# Patient Record
Sex: Male | Born: 1960
Health system: Southern US, Community
[De-identification: ages and names within clinical notes are randomized; demographics above are authoritative.]

## PROBLEM LIST (undated history)

## (undated) DIAGNOSIS — G4730 Sleep apnea, unspecified: Secondary | ICD-10-CM

## (undated) DIAGNOSIS — E785 Hyperlipidemia, unspecified: Secondary | ICD-10-CM

## (undated) DIAGNOSIS — Z8619 Personal history of other infectious and parasitic diseases: Secondary | ICD-10-CM

## (undated) DIAGNOSIS — F1010 Alcohol abuse, uncomplicated: Secondary | ICD-10-CM

## (undated) DIAGNOSIS — M109 Gout, unspecified: Secondary | ICD-10-CM

## (undated) HISTORY — DX: Sleep apnea, unspecified: G47.30

## (undated) HISTORY — DX: Hyperlipidemia, unspecified: E78.5

## (undated) HISTORY — PX: NO PAST SURGERIES: SHX2092

## (undated) HISTORY — DX: Alcohol abuse, uncomplicated: F10.10

## (undated) HISTORY — DX: Gout, unspecified: M10.9

## (undated) HISTORY — DX: Personal history of other infectious and parasitic diseases: Z86.19

---

## 2012-02-28 LAB — HM COLONOSCOPY

## 2013-04-01 DIAGNOSIS — G4730 Sleep apnea, unspecified: Secondary | ICD-10-CM | POA: Insufficient documentation

## 2015-01-24 DIAGNOSIS — M545 Low back pain, unspecified: Secondary | ICD-10-CM | POA: Insufficient documentation

## 2016-03-15 DIAGNOSIS — M109 Gout, unspecified: Secondary | ICD-10-CM | POA: Insufficient documentation

## 2016-03-15 DIAGNOSIS — E782 Mixed hyperlipidemia: Secondary | ICD-10-CM | POA: Insufficient documentation

## 2017-04-24 DIAGNOSIS — Z720 Tobacco use: Secondary | ICD-10-CM | POA: Insufficient documentation

## 2019-02-04 ENCOUNTER — Telehealth: Payer: Self-pay

## 2019-02-04 ENCOUNTER — Encounter: Payer: Self-pay | Admitting: Internal Medicine

## 2019-02-04 NOTE — Telephone Encounter (Signed)
Wife establishing okay per Dr. Larose Kells.

## 2019-02-08 ENCOUNTER — Other Ambulatory Visit: Payer: Self-pay

## 2019-02-08 ENCOUNTER — Encounter: Payer: Self-pay | Admitting: Internal Medicine

## 2019-02-08 ENCOUNTER — Ambulatory Visit (INDEPENDENT_AMBULATORY_CARE_PROVIDER_SITE_OTHER): Payer: 59 | Admitting: Internal Medicine

## 2019-02-08 VITALS — BP 115/52 | HR 56 | Temp 97.9°F | Resp 16 | Ht 66.5 in | Wt 222.4 lb

## 2019-02-08 DIAGNOSIS — Z720 Tobacco use: Secondary | ICD-10-CM

## 2019-02-08 DIAGNOSIS — G4730 Sleep apnea, unspecified: Secondary | ICD-10-CM | POA: Diagnosis not present

## 2019-02-08 NOTE — Patient Instructions (Signed)
  GO TO THE FRONT DESK Schedule your next appointment   For a physical exam in 4 months

## 2019-02-08 NOTE — Progress Notes (Signed)
Pre visit review using our clinic review tool, if applicable. No additional management support is needed unless otherwise documented below in the visit note. 

## 2019-02-08 NOTE — Progress Notes (Signed)
Subjective:    Patient ID: Brent Slimmer., male    DOB: 04-May-1961, 58 y.o.   MRN: [id number]  DOS:  02/08/2019 Type of visit - description: New patient The patient moved to [facility] 4 months ago and needs a new PCP. In general feels well and have no concerns. Good compliance with CPAP He reports has a problem with alcohol, on further questioning, he gets 5 for 6 beers during the weekends and apparently none during the weekdays.   Review of Systems Denies fever chills No chest pain no difficulty breathing No nausea, vomiting, diarrhea  Past Medical History:  Diagnosis Date  . ETOH abuse   . History of chicken pox   . Sleep apnea     Past Surgical History:  Procedure Laterality Date  . NO PAST SURGERIES      Social History   Socioeconomic History  . Marital status: Married    Spouse name: Not on file  . Number of children: 2  . Years of education: Not on file  . Highest education level: Not on file  Occupational History  . Occupation: Optometrist distribution  Social Needs  . Financial resource strain: Not on file  . Food insecurity    Worry: Not on file    Inability: Not on file  . Transportation needs    Medical: Not on file    Non-medical: Not on file  Tobacco Use  . Smoking status: Current Every Day Smoker    Packs/day: 0.50  . Smokeless tobacco: Never Used  Substance and Sexual Activity  . Alcohol use: Yes    Comment: weekends   . Drug use: Not on file  . Sexual activity: Not on file  Lifestyle  . Physical activity    Days per week: Not on file    Minutes per session: Not on file  . Stress: Not on file  Relationships  . Social Herbalist on phone: Not on file    Gets together: Not on file    Attends religious service: Not on file    Active member of club or organization: Not on file    Attends meetings of clubs or organizations: Not on file    Relationship status: Not on file  . Intimate partner violence    Fear of current  or ex partner: Not on file    Emotionally abused: Not on file    Physically abused: Not on file    Forced sexual activity: Not on file  Other Topics Concern  . Not on file  Social History Narrative   Moved from [place] to [place] 09/2018   Household: pt and wife    Has 2 sons from previous marriage     Family History  Problem Relation Age of Onset  . Arthritis Mother   . Hypertension Mother   . Early death Father   . Drug abuse Brother   . Alcohol abuse Brother   . Alcohol abuse Son   . Drug abuse Son   . Depression Brother   . Alcohol abuse Brother   . Colon cancer Neg Hx   . Prostate cancer Neg Hx      Allergies as of 02/08/2019      Reactions   Shellfish Allergy Anaphylaxis      Medication List       Accurate as of February 08, 2019  4:53 PM. If you have any questions, ask your nurse or doctor.  COLLAGEN EX Apply topically.   multivitamin with minerals Tabs tablet Take 1 tablet by mouth daily.   VISION VITAMINS PO Take by mouth.   VITAMIN C PO Take by mouth.   VITAMIN D PO Take by mouth.           Objective:   Physical Exam BP (!) 115/52 (BP Location: Left Arm, Patient Position: Sitting, Cuff Size: Normal)   Pulse (!) 56   Temp 97.9 F (36.6 C) (Temporal)   Resp 16   Ht 5' 6.5" (1.689 m)   Wt 222 lb 6 oz (100.9 kg)   SpO2 99%   BMI 35.35 kg/m  General:   Well developed, NAD, BMI noted.  HEENT:  Normocephalic . Face symmetric, atraumatic Lungs:  CTA B Normal respiratory effort, no intercostal retractions, no accessory muscle use. Heart: RRR,  no murmur.  no pretibial edema bilaterally  Abdomen:  Not distended, soft, non-tender. No rebound or rigidity.   Skin: Not pale. Not jaundice Neurologic:  alert & oriented X3.  Speech normal, gait appropriate for age and unassisted Psych--  Cognition and judgment appear intact.  Cooperative with normal attention span and concentration.  Behavior appropriate. No anxious or depressed  appearing.     Assessment     ASSESSMENT EtOH abuse  (?), weekends "5 to 6 beers" Sleep apnea, on CPAP   Plan: Here to get established, seems to be doing well EtOH abuse? Will review his records Tobacco abuse: Ongoing, will discuss cessation on RTC.  On chart review, he tried Chantix in 2018 OSA: Good compliance with CPAP, denies feeling fatigue. Preventive care: Plans to get a flu shot at work. On chart review, had a colonoscopy in 2013, diverticulosis, no polyps. 10 years RTC 4 months CPX fasting

## 2019-02-10 ENCOUNTER — Telehealth: Payer: Self-pay

## 2019-02-10 NOTE — Telephone Encounter (Signed)
ROI faxed to [facility] at [phone]. ROI sent for scanning. Awaiting records.  

## 2019-02-25 NOTE — Telephone Encounter (Signed)
Records received and placed in MD red folder for review.  

## 2019-02-25 NOTE — Telephone Encounter (Signed)
Records reviewed CPX from 03-2016: PSA 0.5, "up-to-date with colonoscopy".  No actual report They reported history of gout.  Was on allopurinol. Was prescribed Chantix 04-2017

## 2019-03-03 ENCOUNTER — Encounter: Payer: Self-pay | Admitting: Internal Medicine

## 2019-06-15 ENCOUNTER — Encounter: Payer: 59 | Admitting: Internal Medicine

## 2019-06-22 ENCOUNTER — Other Ambulatory Visit: Payer: Self-pay

## 2019-06-22 ENCOUNTER — Encounter: Payer: Self-pay | Admitting: Internal Medicine

## 2019-06-22 ENCOUNTER — Ambulatory Visit (INDEPENDENT_AMBULATORY_CARE_PROVIDER_SITE_OTHER): Payer: 59 | Admitting: Internal Medicine

## 2019-06-22 VITALS — BP 123/66 | HR 81 | Temp 98.1°F | Ht 66.5 in | Wt 227.4 lb

## 2019-06-22 DIAGNOSIS — Z0000 Encounter for general adult medical examination without abnormal findings: Secondary | ICD-10-CM | POA: Diagnosis not present

## 2019-06-22 DIAGNOSIS — Z122 Encounter for screening for malignant neoplasm of respiratory organs: Secondary | ICD-10-CM

## 2019-06-22 DIAGNOSIS — E782 Mixed hyperlipidemia: Secondary | ICD-10-CM

## 2019-06-22 DIAGNOSIS — G4730 Sleep apnea, unspecified: Secondary | ICD-10-CM | POA: Diagnosis not present

## 2019-06-22 DIAGNOSIS — Z23 Encounter for immunization: Secondary | ICD-10-CM | POA: Diagnosis not present

## 2019-06-22 NOTE — Patient Instructions (Addendum)
We will refer you to pulmonary  Please go back to your routine exercises  American Heart Association has excellent information about healthy lifestyle and quitting tobacco.  Please visit their website  GO TO THE LAB : Get the blood work     GO TO THE FRONT DESK Schedule your next appointment   for a checkup in 6 months

## 2019-06-22 NOTE — Progress Notes (Signed)
Subjective:    Patient ID: Brent Grist., male    DOB: 06-27-1960, 59 y.o.   MRN: [id number]  DOS:  06/22/2019 Type of visit - description: CPX Since the last office visit he is doing well. Other issues addressed including EtOH, tobacco, OSA.  Review of Systems  A 14 point review of systems is negative   Past Medical History:  Diagnosis Date  . ETOH abuse   . Gout   . History of chicken pox   . Hyperlipidemia   . Sleep apnea    on CPAP    Past Surgical History:  Procedure Laterality Date  . NO PAST SURGERIES      Social History   Socioeconomic History  . Marital status: Married    Spouse name: Not on file  . Number of children: 2  . Years of education: Not on file  . Highest education level: Not on file  Occupational History  . Occupation: Ambulance person distribution  Tobacco Use  . Smoking status: Current Every Day Smoker    Packs/day: 0.50    Start date: 88  . Smokeless tobacco: Never Used  Substance and Sexual Activity  . Alcohol use: Yes    Comment: weekends   . Drug use: Not Currently  . Sexual activity: Not on file  Other Topics Concern  . Not on file  Social History Narrative   Moved from [place] to [place] 09/2018   Household: pt and wife    Has 2 sons from previous marriage   Social Determinants of Health   Financial Resource Strain:   . Difficulty of Paying Living Expenses: Not on file  Food Insecurity:   . Worried About Programme researcher, broadcasting/film/video in the Last Year: Not on file  . Ran Out of Food in the Last Year: Not on file  Transportation Needs:   . Lack of Transportation (Medical): Not on file  . Lack of Transportation (Non-Medical): Not on file  Physical Activity:   . Days of Exercise per Week: Not on file  . Minutes of Exercise per Session: Not on file  Stress:   . Feeling of Stress : Not on file  Social Connections:   . Frequency of Communication with Friends and Family: Not on file  . Frequency of Social Gatherings with Friends and  Family: Not on file  . Attends Religious Services: Not on file  . Active Member of Clubs or Organizations: Not on file  . Attends Banker Meetings: Not on file  . Marital Status: Not on file  Intimate Partner Violence:   . Fear of Current or Ex-Partner: Not on file  . Emotionally Abused: Not on file  . Physically Abused: Not on file  . Sexually Abused: Not on file      Family History  Problem Relation Age of Onset  . Arthritis Mother   . Hypertension Mother   . Early death Father   . Drug abuse Brother   . Alcohol abuse Brother   . Alcohol abuse Son   . Drug abuse Son   . Depression Brother   . Alcohol abuse Brother   . Colon cancer Neg Hx   . Prostate cancer Neg Hx      Allergies as of 06/22/2019      Reactions   Shellfish Allergy Anaphylaxis      Medication List       Accurate as of June 22, 2019 11:59 PM. If you have any questions, ask  your nurse or doctor.        COLLAGEN EX Apply topically.   multivitamin with minerals Tabs tablet Take 1 tablet by mouth daily.   VISION VITAMINS PO Take by mouth.   VITAMIN C PO Take by mouth.   VITAMIN D PO Take by mouth.           Objective:   Physical Exam BP 123/66 (BP Location: Right Arm, Cuff Size: Normal)   Pulse 81   Temp 98.1 F (36.7 C) (Temporal)   Ht 5' 6.5" (1.689 m)   Wt 227 lb 6.4 oz (103.1 kg)   SpO2 98%   BMI 36.15 kg/m  General: Well developed, NAD, BMI noted Neck: No  thyromegaly  HEENT:  Normocephalic . Face symmetric, atraumatic Lungs:  CTA B Normal respiratory effort, no intercostal retractions, no accessory muscle use. Heart: RRR,  no murmur.  No pretibial edema bilaterally  Abdomen:  Not distended, soft, non-tender. No rebound or rigidity. DRE: Normal sphincter tone, prostate normal, brown stools. Skin: Exposed areas without rash. Not pale. Not jaundice Neurologic:  alert & oriented X3.  Speech normal, gait appropriate for age and unassisted Strength  symmetric and appropriate for age.  Psych: Cognition and judgment appear intact.  Cooperative with normal attention span and concentration.  Behavior appropriate. No anxious or depressed appearing.     Assessment     ASSESSMENT EtOH abuse, still drinks during the weekends  Sleep apnea, on CPAP History of gout, at some point was on allopurinol   Plan: Here for CPX OSA: Good compliance w/ Cpap but he sometimes still snores over the  CPAP.   Occasionally feels tired but he thinks is related to been very inactive and wt gain. Plan: Increase physical activity, lose some weight, refer to pulmonary. RTC 6 months    This visit occurred during the SARS-CoV-2 public health emergency.  Safety protocols were in place, including screening questions prior to the visit, additional usage of staff PPE, and extensive cleaning of exam room while observing appropriate contact time as indicated for disinfecting solutions.

## 2019-06-23 LAB — LIPID PANEL
Cholesterol: 216 mg/dL — ABNORMAL HIGH (ref 0–200)
HDL: 51.6 mg/dL (ref >39.00)
NonHDL: 164.87
Total CHOL/HDL Ratio: 4
Triglycerides: 300 mg/dL — ABNORMAL HIGH (ref 0.0–149.0)
VLDL: 60 mg/dL — ABNORMAL HIGH (ref 0.0–40.0)

## 2019-06-23 LAB — COMPREHENSIVE METABOLIC PANEL
ALT: 19 U/L (ref 0–53)
AST: 18 U/L (ref 0–37)
Albumin: 4.6 g/dL (ref 3.5–5.2)
Alkaline Phosphatase: 31 U/L — ABNORMAL LOW (ref 39–117)
BUN: 18 mg/dL (ref 6–23)
CO2: 26 mEq/L (ref 19–32)
Calcium: 10 mg/dL (ref 8.4–10.5)
Chloride: 104 mEq/L (ref 96–112)
Creatinine, Ser: 1.29 mg/dL (ref 0.40–1.50)
GFR: 57.09 mL/min — ABNORMAL LOW (ref >60.00)
Glucose, Bld: 105 mg/dL — ABNORMAL HIGH (ref 70–99)
Potassium: 4 mEq/L (ref 3.5–5.1)
Sodium: 139 mEq/L (ref 135–145)
Total Bilirubin: 0.4 mg/dL (ref 0.2–1.2)
Total Protein: 6.9 g/dL (ref 6.0–8.3)

## 2019-06-23 LAB — TSH: TSH: 0.7 u[IU]/mL (ref 0.35–4.50)

## 2019-06-23 LAB — CBC
HCT: 41.5 % (ref 39.0–52.0)
Hemoglobin: 14 g/dL (ref 13.0–17.0)
MCHC: 33.7 g/dL (ref 30.0–36.0)
MCV: 88.8 fl (ref 78.0–100.0)
Platelets: 180 10*3/uL (ref 150.0–400.0)
RBC: 4.67 Mil/uL (ref 4.22–5.81)
RDW: 13.2 % (ref 11.5–15.5)
WBC: 8.8 10*3/uL (ref 4.0–10.5)

## 2019-06-23 LAB — LDL CHOLESTEROL, DIRECT: Direct LDL: 121 mg/dL

## 2019-06-23 LAB — PSA: PSA: 0.87 ng/mL (ref 0.10–4.00)

## 2019-06-24 DIAGNOSIS — Z09 Encounter for follow-up examination after completed treatment for conditions other than malignant neoplasm: Secondary | ICD-10-CM | POA: Insufficient documentation

## 2019-06-24 DIAGNOSIS — Z0000 Encounter for general adult medical examination without abnormal findings: Secondary | ICD-10-CM | POA: Insufficient documentation

## 2019-06-24 NOTE — Assessment & Plan Note (Signed)
Here for CPX OSA: Good compliance w/ Cpap but he sometimes still snores over the  CPAP.   Occasionally feels tired but he thinks is related to been very inactive and wt gain. Plan: Increase physical activity, lose some weight, refer to pulmonary. RTC 6 months

## 2019-06-24 NOTE — Assessment & Plan Note (Signed)
-  Tdap: Today  -Had a flu shot -JDY:[id number] 02-2012, diverticulosis, internal hemorrhoids, recheck in 10 years.  See report -Prostate cancer screening: DRE normal, check a PSA -Diet, exercise: He was doing great, here lately however is not exercising and is overeating.  Counseled -Tobacco: Not ready to quit, knows there is help available including the patches and medications.  Previous PCP did prescribe Chantix, does not recall the results of that. -Lung cancer screening: Smoked for the last 50 years, typically half pack a day, lung cancer screening discussed.we agreed on a referral  -EtOH: Drinks on the weekends only. -Labs: CMP, FLP, CBC, TSH, PSA.

## 2019-07-07 ENCOUNTER — Telehealth: Payer: Self-pay | Admitting: Internal Medicine

## 2019-07-07 ENCOUNTER — Encounter: Payer: Self-pay | Admitting: Internal Medicine

## 2019-07-07 NOTE — Telephone Encounter (Signed)
Advise patient: He does not qualify technically however he could proceed if so desired, he will be paying out of pocket. Pricing per radiology, he will have to contact them.

## 2019-07-07 NOTE — Telephone Encounter (Signed)
Spoke w/ Pt- made him aware of below. Pt verbalized understanding- he is okay w/ not proceeding w/ CT at this time.

## 2019-07-07 NOTE — Telephone Encounter (Signed)
FYI

## 2019-07-07 NOTE — Telephone Encounter (Signed)
Marcie from [facility] CT called stating Patient doesn't qualify for lung screening. Stated he only had a 25 year history of smoking not 30. Patient also needs to be called to informed not to show up to his appt this afternoon

## 2019-08-05 ENCOUNTER — Institutional Professional Consult (permissible substitution): Payer: 59 | Admitting: Pulmonary Disease

## 2019-08-06 ENCOUNTER — Institutional Professional Consult (permissible substitution): Payer: 59 | Admitting: Pulmonary Disease

## 2019-08-18 ENCOUNTER — Ambulatory Visit (INDEPENDENT_AMBULATORY_CARE_PROVIDER_SITE_OTHER): Payer: 59

## 2019-08-18 ENCOUNTER — Other Ambulatory Visit: Payer: Self-pay | Admitting: General Surgery

## 2019-08-18 ENCOUNTER — Ambulatory Visit (INDEPENDENT_AMBULATORY_CARE_PROVIDER_SITE_OTHER): Payer: 59 | Admitting: Pulmonary Disease

## 2019-08-18 ENCOUNTER — Encounter: Payer: Self-pay | Admitting: Pulmonary Disease

## 2019-08-18 ENCOUNTER — Other Ambulatory Visit: Payer: Self-pay

## 2019-08-18 VITALS — BP 128/78 | HR 80 | Temp 98.1°F | Ht 66.5 in | Wt 222.0 lb

## 2019-08-18 DIAGNOSIS — J410 Simple chronic bronchitis: Secondary | ICD-10-CM | POA: Diagnosis not present

## 2019-08-18 DIAGNOSIS — Z720 Tobacco use: Secondary | ICD-10-CM | POA: Diagnosis not present

## 2019-08-18 DIAGNOSIS — G4733 Obstructive sleep apnea (adult) (pediatric): Secondary | ICD-10-CM

## 2019-08-18 NOTE — Progress Notes (Signed)
Subjective:    Patient ID: Brent Scott., male    DOB: [birth year], 59 y.o.   MRN: [id number]  HPI  59 year old smoker, Health and safety inspector for [employer], presents to establish care for OSA.  He has moved from [place] to [place] in the last year OSA was diagnosed more than 20 years ago and he is on his third CPAP machine, which he obtained in 2018. Last office visit from 09/2016 [facility] pulmonary was reviewed, is maintained on CPAP of 11 cm with good compliance.  He has settled down with a full facemask but would like to hear about alternatives.  DME is [facility].  CPAP download was reviewed which shows excellent compliance 11 cm over the past 3 months more than 8 hours per night, with no residual events with large leak. He states that this leak is arising from where the hose connects to the mask.  His wife has however noted snoring in spite of using the CPAP machine.  Epworth sleepiness score is 13 Bedtime is between 9 and 10 PM, sleep latency is minutes, he generally sleeps on his side with 1 pillow, reports 1 bathroom visit for nocturia and is out of bed by 5 AM with dryness of mouth but denies headaches and feels rested.  He has gained 20 pounds in the last 2 years There is no history suggestive of cataplexy, sleep paralysis or parasomnias  He smokes about half pack per day, about 25 pack years and drinks alcohol socially.  He is recreational drugs 30 years ago.  Past Medical History:  Diagnosis Date  . ETOH abuse   . Gout   . History of chicken pox   . Hyperlipidemia   . Sleep apnea    on CPAP    Past Surgical History:  Procedure Laterality Date  . NO PAST SURGERIES      Allergies  Allergen Reactions  . Shellfish Allergy Anaphylaxis    Social History   Socioeconomic History  . Marital status: Married    Spouse name: Not on file  . Number of children: 2  . Years of education: Not on file  . Highest education level: Not on file  Occupational History    . Occupation: Optometrist distribution  Tobacco Use  . Smoking status: Current Every Day Smoker    Packs/day: 0.50    Start date: 22  . Smokeless tobacco: Never Used  Substance and Sexual Activity  . Alcohol use: Yes    Comment: weekends   . Drug use: Not Currently  . Sexual activity: Not on file  Other Topics Concern  . Not on file  Social History Narrative   Moved from [place] to [place] 09/2018   Household: pt and wife    Has 2 sons from previous marriage   Social Determinants of Health   Financial Resource Strain:   . Difficulty of Paying Living Expenses: Not on file  Food Insecurity:   . Worried About Charity fundraiser in the Last Year: Not on file  . Ran Out of Food in the Last Year: Not on file  Transportation Needs:   . Lack of Transportation (Medical): Not on file  . Lack of Transportation (Non-Medical): Not on file  Physical Activity:   . Days of Exercise per Week: Not on file  . Minutes of Exercise per Session: Not on file  Stress:   . Feeling of Stress : Not on file  Social Connections:   . Frequency of Communication  with Friends and Family: Not on file  . Frequency of Social Gatherings with Friends and Family: Not on file  . Attends Religious Services: Not on file  . Active Member of Clubs or Organizations: Not on file  . Attends Banker Meetings: Not on file  . Marital Status: Not on file  Intimate Partner Violence:   . Fear of Current or Ex-Partner: Not on file  . Emotionally Abused: Not on file  . Physically Abused: Not on file  . Sexually Abused: Not on file     Family History  Problem Relation Age of Onset  . Arthritis Mother   . Hypertension Mother   . Early death Father   . Drug abuse Brother   . Alcohol abuse Brother   . Alcohol abuse Son   . Drug abuse Son   . Depression Brother   . Alcohol abuse Brother   . Colon cancer Neg Hx   . Prostate cancer Neg Hx     Review of Systems Constitutional: negative for anorexia,  fevers and sweats  Eyes: negative for irritation, redness and visual disturbance  Ears, nose, mouth, throat, and face: negative for earaches, epistaxis, nasal congestion and sore throat  Respiratory: negative for cough, dyspnea on exertion, sputum and wheezing  Cardiovascular: negative for chest pain, dyspnea, lower extremity edema, orthopnea, palpitations and syncope  Gastrointestinal: negative for abdominal pain, constipation, diarrhea, melena, nausea and vomiting  Genitourinary:negative for dysuria, frequency and hematuria  Hematologic/lymphatic: negative for bleeding, easy bruising and lymphadenopathy  Musculoskeletal:negative for arthralgias, muscle weakness and stiff joints  Neurological: negative for coordination problems, gait problems, headaches and weakness  Endocrine: negative for diabetic symptoms including polydipsia, polyuria and weight loss     Objective:   Physical Exam  Gen. Pleasant, muscular build, in no distress, normal affect ENT - no pallor,icterus, no post nasal drip, class 2-3 airway Neck: No JVD, no thyromegaly, no carotid bruits Lungs: no use of accessory muscles, no dullness to percussion, decreased without rales or rhonchi  Cardiovascular: Rhythm regular, heart sounds  normal, no murmurs or gallops, no peripheral edema Abdomen: soft and non-tender, no hepatosplenomegaly, BS normal. Musculoskeletal: No deformities, no cyanosis or clubbing Neuro:  alert, non focal, no tremors       Assessment & Plan:

## 2019-08-18 NOTE — Patient Instructions (Signed)
  CPAP is working well at current settings. We will increase pressure to 12 cm to cut out snoring, call us back in 1 month to report.  CPAP supplies will be renewed for a year -trial of AirFit F30 full facemask DME [facility]  We will try to track down your sleep study from [facility] pulmonary PA

## 2019-08-18 NOTE — Assessment & Plan Note (Signed)
Smoking cessation advised is the most important intervention Chest x-ray today for smoker's cough

## 2019-08-18 NOTE — Assessment & Plan Note (Signed)
CPAP is working well at current settings. Events are well controlled on 11 cm We will increase pressure to 12 cm to cut out snoring, call us back in 1 month to report.  CPAP supplies will be renewed for a year -trial of AirFit F30 full facemask DME [facility]  We will try to track down your sleep study from [facility] pulmonary PA  We discussed cardiovascular implications and alternative treatments including inspire device Weight loss encouraged, compliance with goal of at least 4-6 hrs every night is the expectation. Advised against medications with sedative side effects Cautioned against driving when sleepy - understanding that sleepiness will vary on a day to day basis

## 2019-08-19 ENCOUNTER — Other Ambulatory Visit: Payer: Self-pay | Admitting: Pulmonary Disease

## 2019-08-19 ENCOUNTER — Telehealth: Payer: Self-pay | Admitting: Pulmonary Disease

## 2019-08-19 DIAGNOSIS — F17200 Nicotine dependence, unspecified, uncomplicated: Secondary | ICD-10-CM

## 2019-08-19 NOTE — Telephone Encounter (Signed)
I called Adela Lank but she did not answer. I left a voicemail to have her call us back.

## 2019-08-19 NOTE — Telephone Encounter (Addendum)
Spoke with Adela Lank and she is wanting to know if his CPAP pressure was changed. I advised her that [facility] received the order this morning according to the referral notes and that someone should reach out to them shortly. She agreed and nothing further is needed.

## 2019-08-19 NOTE — Telephone Encounter (Signed)
Brent Scott calling back about her husband's cpap machine. Brent Scott can be reached at [phone].

## 2019-08-23 ENCOUNTER — Telehealth: Payer: Self-pay | Admitting: Pulmonary Disease

## 2019-08-23 NOTE — Telephone Encounter (Signed)
[facility] HIM Dept received fax from [facility], they have no Sleep Study records for this patient.  08/23/19  KLM

## 2019-08-24 ENCOUNTER — Telehealth: Payer: Self-pay | Admitting: Pulmonary Disease

## 2019-08-25 ENCOUNTER — Telehealth: Payer: Self-pay

## 2019-08-25 ENCOUNTER — Encounter: Payer: Self-pay | Admitting: Internal Medicine

## 2019-08-25 ENCOUNTER — Ambulatory Visit: Payer: 59 | Admitting: Internal Medicine

## 2019-08-25 ENCOUNTER — Other Ambulatory Visit: Payer: Self-pay

## 2019-08-25 VITALS — BP 136/67 | HR 62 | Temp 97.5°F | Resp 18 | Ht 67.0 in | Wt 225.4 lb

## 2019-08-25 DIAGNOSIS — Z720 Tobacco use: Secondary | ICD-10-CM | POA: Diagnosis not present

## 2019-08-25 DIAGNOSIS — Z9989 Dependence on other enabling machines and devices: Secondary | ICD-10-CM

## 2019-08-25 DIAGNOSIS — G4733 Obstructive sleep apnea (adult) (pediatric): Secondary | ICD-10-CM | POA: Diagnosis not present

## 2019-08-25 MED ORDER — CHANTIX STARTING MONTH PAK 0.5 MG X 11 & 1 MG X 42 PO TABS: ORAL_TABLET | ORAL | 0 refills | Status: DC

## 2019-08-25 MED ORDER — BUPROPION HCL 100 MG PO TABS: ORAL_TABLET | ORAL | 2 refills | Status: DC

## 2019-08-25 NOTE — Telephone Encounter (Signed)
Chantix not covered- must first try and fail bupropion (generic Zyban).

## 2019-08-25 NOTE — Progress Notes (Signed)
Pre visit review using our clinic review tool, if applicable. No additional management support is needed unless otherwise documented below in the visit note. 

## 2019-08-25 NOTE — Patient Instructions (Signed)

## 2019-08-25 NOTE — Progress Notes (Signed)
   Subjective:    Patient ID: Brent Scott., male    DOB: 1960-12-11, 59 y.o.   MRN: [id number]  DOS:  08/25/2019 Type of visit - description: Follow-up His main concern is quitting tobacco. We review his history of smoking previous attempts. OSA: Getting settings adjusted    Review of Systems See above   Past Medical History:  Diagnosis Date  . ETOH abuse   . Gout   . History of chicken pox   . Hyperlipidemia   . Sleep apnea    on CPAP    Past Surgical History:  Procedure Laterality Date  . NO PAST SURGERIES      Allergies as of 08/25/2019      Reactions   Shellfish Allergy Anaphylaxis      Medication List       Accurate as of August 25, 2019 11:59 PM. If you have any questions, ask your nurse or doctor.        buPROPion 100 MG tablet Commonly known as: WELLBUTRIN Take 1 tablet by mouth daily for 1 week, then increase to 1 tablet twice daily. Started by: Valentino Nose, CMA   COLLAGEN EX Apply topically.   multivitamin with minerals Tabs tablet Take 1 tablet by mouth daily.   VISION VITAMINS PO Take by mouth.   VITAMIN C PO Take by mouth.   VITAMIN D PO Take by mouth.          Objective:   Physical Exam BP 136/67 (BP Location: Left Arm, Patient Position: Sitting, Cuff Size: Normal)   Pulse 62   Temp (!) 97.5 F (36.4 C) (Temporal)   Resp 18   Ht 5\' 7"  (1.702 m)   Wt 225 lb 6 oz (102.2 kg)   SpO2 100%   BMI 35.30 kg/m  General:   Well developed, NAD, BMI noted. HEENT:  Normocephalic . Face symmetric, atraumatic  Skin: Not pale. Not jaundice Neurologic:  alert & oriented X3.  Speech normal, gait appropriate for age and unassisted Psych--  Cognition and judgment appear intact.  Cooperative with normal attention span and concentration.  Behavior appropriate. No anxious or depressed appearing.      Assessment      ASSESSMENT EtOH abuse, still drinks during the weekends  Sleep apnea, on CPAP History of gout, at some  point was on allopurinol   Plan: Tobacco abuse: Smokes half pack a day for many years.  Previously has quit cold few times but that lasted only few weeks. He also tried for few days Chantix, does not recall any side effects but he did not complete the full prescription so he request to try again. Starting package sent, side effects and how it works discussed.  To quit tobacco 2 weeks after he starts Chantix.  Okay to use his months amounts of nicotine gum as needed, techniques to help with cravings discussed. Further follow-up Chantix dose he will call as he will need a 90-day supply sent. Addendum:   Chantix not okay by his insurance, will switch to Wellbutrin 100 mg twice daily  OSA: saw pulmonary, settings to be adjusted     This visit occurred during the SARS-CoV-2 public health emergency.  Safety protocols were in place, including screening questions prior to the visit, additional usage of staff PPE, and extensive cleaning of exam room while observing appropriate contact time as indicated for disinfecting solutions.

## 2019-08-25 NOTE — Telephone Encounter (Signed)
LMOM for patient, if indeed Chantix is not approved, he can take Wellbutrin 100 mg: One a day for 1 week, then twice a day, #60 and 2 refills.

## 2019-08-25 NOTE — Telephone Encounter (Signed)
Spoke w/ Pt- informed of his insurance decision. He agreed to try bupropion 100mg  as directed by Dr. . Rx sent to [facility].

## 2019-08-26 ENCOUNTER — Telehealth: Payer: Self-pay

## 2019-08-26 ENCOUNTER — Telehealth: Payer: Self-pay | Admitting: Internal Medicine

## 2019-08-26 NOTE — Telephone Encounter (Signed)
Caller : Patient's Wife Adela Lank   Telephone : [phone]  Requesting a call back from CMA in regards to prior authorization for Chantix.

## 2019-08-26 NOTE — Telephone Encounter (Signed)
Patient called in to let Dr. Drue Novel know that the insurance stated that Dr. Drue Novel wrote the proscription wrong. The patient would like a follow up call to discuss this with the nurse or Dr. Drue Novel please give the patient a call at [phone] as soon as possible thanks,

## 2019-08-26 NOTE — Assessment & Plan Note (Signed)
Tobacco abuse: Smokes half pack a day for many years.  Previously has quit cold [place] few times but that lasted only few weeks. He also tried for few days Chantix, does not recall any side effects but he did not complete the full prescription so he request to try again. Starting package sent, side effects and how it works discussed.  To quit tobacco 2 weeks after he starts Chantix.  Okay to use his months amounts of nicotine gum as needed, techniques to help with cravings discussed. Further follow-up Chantix dose he will call as he will need a 90-day supply sent. Addendum:   Chantix not okay by his insurance, will switch to Wellbutrin 100 mg twice daily  OSA: saw pulmonary, settings to be adjusted

## 2019-08-26 NOTE — Telephone Encounter (Signed)
I informed Pt yesterday that PA for Chantix was denied. Spoke w/ Pt again- he stated that it was his wife who called this morning- she was able to get Chantix approved. Informed that unfortunately w/o her chart I don't know how her MD office got the PA approved however his insurance is requiring the step therapy w/ bupropion first. Pt verbalized understanding and thanked me for my call.

## 2019-08-26 NOTE — Telephone Encounter (Signed)
I have already spoke twice w/ the Pt himself regarding the PA denial for Chantix.

## 2019-08-26 NOTE — Telephone Encounter (Signed)
  Patient need a urgent approval:  Patient states that he needs Prior Authorization to [facility] for Chantix  Please make sure that the prior authorization NO states to generic medication. Like Wellbutrin.    Prior Authorization # [id number]

## 2019-08-26 NOTE — Telephone Encounter (Signed)
LMTC x 1  

## 2019-08-27 ENCOUNTER — Other Ambulatory Visit: Payer: Self-pay | Admitting: Internal Medicine

## 2019-08-27 NOTE — Telephone Encounter (Signed)
I have already spoke twice with the Pt regarding PA denial.

## 2019-08-30 NOTE — Telephone Encounter (Signed)
LMTC x 1  

## 2019-08-30 NOTE — Telephone Encounter (Signed)
Got it, thx

## 2019-08-31 ENCOUNTER — Telehealth: Payer: Self-pay | Admitting: Internal Medicine

## 2019-08-31 NOTE — Telephone Encounter (Addendum)
Per informed me via telephone on 08/26/2019 that he has never tried bupropion but now Pt's wife is calling to say that he has- will try to appeal PA denial for Chantix.

## 2019-08-31 NOTE — Telephone Encounter (Signed)
PT returned call, please call back

## 2019-08-31 NOTE — Telephone Encounter (Signed)
Caller : Tallen Schnorr Call Back # [phone]  Concern: Patient's wife would like for someone is the office to mark on the prior authorization paperwork that the patient has taken generic medications for drug Chantix .

## 2019-09-01 NOTE — Telephone Encounter (Signed)
Spoke with pt regarding lung cancer screening. Pt is going to call his insurance company to see what imaging location will be covered. Will await call back from pt. Will close this message and refer to referral notes.

## 2019-09-03 NOTE — Telephone Encounter (Signed)
Appeal information refaxed to [employer] again today.

## 2019-09-06 ENCOUNTER — Telehealth: Payer: Self-pay

## 2019-09-06 NOTE — Telephone Encounter (Signed)
Patient called in to

## 2019-09-14 ENCOUNTER — Telehealth: Payer: Self-pay | Admitting: Acute Care

## 2019-09-14 DIAGNOSIS — F17210 Nicotine dependence, cigarettes, uncomplicated: Secondary | ICD-10-CM

## 2019-09-14 DIAGNOSIS — Z87891 Personal history of nicotine dependence: Secondary | ICD-10-CM

## 2019-09-16 NOTE — Telephone Encounter (Signed)
Per Pt's wife- [employer] has approved starting pack.

## 2019-09-16 NOTE — Telephone Encounter (Signed)
Spoke with pt and scheduled [facility] 10/20/19 9:30 CT ordered Nothing further needed

## 2019-09-22 ENCOUNTER — Other Ambulatory Visit: Payer: Self-pay

## 2019-09-22 MED ORDER — VARENICLINE TARTRATE 1 MG PO TABS: 1.0000 mg | ORAL_TABLET | Freq: Two times a day (BID) | ORAL | 2 refills | Status: DC

## 2019-09-22 NOTE — Progress Notes (Signed)
Pt called- needing Chantix continuing month pack sent to [facility] on [address]. Rx sent.

## 2019-10-06 ENCOUNTER — Telehealth: Payer: Self-pay | Admitting: Internal Medicine

## 2019-10-06 NOTE — Telephone Encounter (Signed)
Caller: [facility]   Call Back # [phone]  Per Synetta Fail, patient's referral per authroization needs to be cancelled from March where Dr Drue Novel referred patient to [facility] for Lung Cancer Screening. Patient did not qualify there and was sent to [facility] and they can not submit a new Prior Authorization for Lung Cancer Screening b/c the other is good until May 2021.  Number to Insurance Company# [phone] auth # [id number]   Please Advise

## 2019-10-07 NOTE — Telephone Encounter (Signed)
Called insurance and cancelled auth. Called [facility] and left VM for Synetta Fail that it has been cancelled.

## 2019-10-13 ENCOUNTER — Telehealth: Payer: Self-pay | Admitting: Internal Medicine

## 2019-10-13 ENCOUNTER — Encounter: Payer: Self-pay | Admitting: Acute Care

## 2019-10-13 NOTE — Telephone Encounter (Signed)
FYI   I have spoken with [facility] 4/29 and withdrawn the case.  I spoke with [employer] today and they can see the case was withdrawn 4/29. Because the pulmonary office requested on 4/26 the dates overlap and an appeal has to be filed. I spoke with Synetta Fail at Pulmonary and she has typed a letter to file appeal for the pt.

## 2019-10-13 NOTE — Telephone Encounter (Signed)
Caller: [facility]   Call Back # [phone]    Today's Call : Per Synetta Fail, Order/Referral has not been cancelled per [employer].Synetta Fail spoke with [facility] and unable to get approval for new request for Lung Cancer Screening due to this reason.    Per Synetta Fail, patient's referral per authroization needs to be cancelled from March where Dr Drue Novel referred patient to [facility] for Lung Cancer Screening. Patient did not qualify there and was sent to [facility]y and they can not submit a new Prior Authorization for Lung Cancer Screening b/c the other is good until May 2021.  Number to Insurance Company# [phone] auth # [id number]

## 2019-10-15 ENCOUNTER — Telehealth: Payer: Self-pay | Admitting: Acute Care

## 2019-10-15 NOTE — Telephone Encounter (Signed)
Spoke with the Brent Scott  He states that he received letter from his ins, [employer] that he needs to have a letter of medical necessity faxed to them at the number provided in the original msg in order for him to have LDCT  Please advise, thanks

## 2019-10-18 NOTE — Telephone Encounter (Signed)
Angelique Blonder and Synetta Fail, Please let me know if you have ever seen this before.  This patient does meet criteria for screening. [employer] has never requested a letter of medical necessity for a preventative healthcare screening. Synetta Fail, can you call and find out if this is correct, or if the patient has misunderstood. Thanks

## 2019-10-18 NOTE — Telephone Encounter (Signed)
I have spoken with the patient and he is aware the CT has been approved and that he should be getting the approval letter that is dated 10/14/2019. I offered to fax him a copy of the approval letter but he was ok with what I was telling him.  I did speak with Reyne Dumas [facility] and they stated they only ask for medical necessity letters after the bill has been submitted  Refer # [id number].  I then called [employer] to see if they were needing the letter of medical necessity and they stated no because the CT had been approved

## 2019-10-18 NOTE — Telephone Encounter (Signed)
LMTC x `1 for pt 

## 2019-10-20 ENCOUNTER — Ambulatory Visit (INDEPENDENT_AMBULATORY_CARE_PROVIDER_SITE_OTHER): Payer: 59 | Admitting: Acute Care

## 2019-10-20 ENCOUNTER — Other Ambulatory Visit: Payer: Self-pay

## 2019-10-20 ENCOUNTER — Encounter: Payer: Self-pay | Admitting: Acute Care

## 2019-10-20 ENCOUNTER — Ambulatory Visit (HOSPITAL_BASED_OUTPATIENT_CLINIC_OR_DEPARTMENT_OTHER)
Admission: RE | Admit: 2019-10-20 | Discharge: 2019-10-20 | Disposition: A | Discharge status: Home / Self Care | Payer: 59 | Source: Ambulatory Visit | Attending: Acute Care | Admitting: Acute Care

## 2019-10-20 DIAGNOSIS — Z122 Encounter for screening for malignant neoplasm of respiratory organs: Secondary | ICD-10-CM

## 2019-10-20 DIAGNOSIS — Z87891 Personal history of nicotine dependence: Secondary | ICD-10-CM | POA: Insufficient documentation

## 2019-10-20 DIAGNOSIS — F17210 Nicotine dependence, cigarettes, uncomplicated: Secondary | ICD-10-CM

## 2019-10-20 NOTE — Progress Notes (Signed)
Shared Decision Making Visit Lung Cancer Screening Program [phone])   Eligibility:  Age 59 y.o.  Pack Years Smoking History Calculation 30 + pack year smoking history (# packs/per year x # years smoked)  Recent History of coughing up blood  no  Unexplained weight loss? no ( >Than 15 pounds within the last 6 months )  Prior History Lung / other cancer no (Diagnosis within the last 5 years already requiring surveillance chest CT Scans).  Smoking Status Current Smoker  Former Smokers: Years since quit: NA  Quit Date: NA  Visit Components:  Discussion included one or more decision making aids. yes  Discussion included risk/benefits of screening. yes  Discussion included potential follow up diagnostic testing for abnormal scans. yes  Discussion included meaning and risk of over diagnosis. yes  Discussion included meaning and risk of False Positives. yes  Discussion included meaning of total radiation exposure. yes  Counseling Included:  Importance of adherence to annual lung cancer LDCT screening. yes  Impact of comorbidities on ability to participate in the program. yes  Ability and willingness to under diagnostic treatment. yes  Smoking Cessation Counseling:  Current Smokers:   Discussed importance of smoking cessation. yes  Information about tobacco cessation classes and interventions provided to patient. yes  Patient provided with "ticket" for LDCT Scan. yes  Symptomatic Patient. no  Counseling NA  Diagnosis Code: Tobacco Use Z72.0  Asymptomatic Patient yes  Counseling (Intermediate counseling: > three minutes counseling) [id number]  Former Smokers:   Discussed the importance of maintaining cigarette abstinence. yes  Diagnosis Code: Personal History of Nicotine Dependence. Q22.979  Information about tobacco cessation classes and interventions provided to patient. Yes  Patient provided with "ticket" for LDCT Scan. yes  Written Order for Lung Cancer  Screening with LDCT placed in Epic. Yes (CT Chest Lung Cancer Screening Low Dose W/O CM) [id number] Z12.2-Screening of respiratory organs Z87.891-Personal history of nicotine dependence  I have spent 25 minutes of face to face time with Mr. Coppa discussing the risks and benefits of lung cancer screening. We viewed a power point together that explained in detail the above noted topics. We paused at intervals to allow for questions to be asked and answered to ensure understanding.We discussed that the single most powerful action that he can take to decrease his risk of developing lung cancer is to quit smoking. We discussed whether or not he is ready to commit to setting a quit date. We discussed options for tools to aid in quitting smoking including nicotine replacement therapy, non-nicotine medications, support groups, Quit Smart classes, and behavior modification. We discussed that often times setting smaller, more achievable goals, such as eliminating 1 cigarette a day for a week and then 2 cigarettes a day for a week can be helpful in slowly decreasing the number of cigarettes smoked. This allows for a sense of accomplishment as well as providing a clinical benefit. I gave him the " Be Stronger Than Your Excuses" card with contact information for community resources, classes, free nicotine replacement therapy, and access to mobile apps, text messaging, and on-line smoking cessation help. I have also given him my card and contact information in the event he needs to contact me. We discussed the time and location of the scan, and that either Abigail Miyamoto RN or I will call with the results within 24-48 hours of receiving them. I have offered him  a copy of the power point we viewed  as a resource in the event they need  reinforcement of the concepts we discussed today in the office. The patient verbalized understanding of all of  the above and had no further questions upon leaving the office. They have my  contact information in the event they have any further questions.  I spent 3 minutes counseling on smoking cessation and the health risks of continued tobacco abuse.  I explained to the patient that there has been a high incidence of coronary artery disease noted on these exams. I explained that this is a non-gated exam therefore degree or severity cannot be determined. This patient is not currently on statin therapy. I have asked the patient to follow-up with their PCP regarding any incidental finding of coronary artery disease and management with diet or medication as their PCP  feels is clinically indicated. The patient verbalized understanding of the above and had no further questions upon completion of the visit.   This visit was performed virtually   Magdalen Spatz, NP 10/20/2019

## 2019-10-20 NOTE — Patient Instructions (Signed)
Thank you for participating in the [facility] Cancer Screening Program. It was our pleasure to meet you today. We will call you with the results of your scan within the next few days. Your scan will be assigned a Lung RADS category score by the physicians reading the scans.  This Lung RADS score determines follow up scanning.  See below for description of categories, and follow up screening recommendations. We will be in touch to schedule your follow up screening annually or based on recommendations of our providers. We will fax a copy of your scan results to your Primary Care Physician, or the physician who referred you to the program, to ensure they have the results. Please call the office if you have any questions or concerns regarding your scanning experience or results.  Our office number is [phone]. Please speak with Denise Phelps, RN. She is our Lung Cancer Screening RN. If she is unavailable when you call, please have the office staff send her a message. She will return your call at her earliest convenience. Remember, if your scan is normal, we will scan you annually as long as you continue to meet the criteria for the program. (Age 55-77, Current smoker or smoker who has quit within the last 15 years). If you are a smoker, remember, quitting is the single most powerful action that you can take to decrease your risk of lung cancer and other pulmonary, breathing related problems. We know quitting is hard, and we are here to help.  Please let us know if there is anything we can do to help you meet your goal of quitting. If you are a former smoker, congratulations. We are proud of you! Remain smoke free! Remember you can refer friends or family members through the number above.  We will screen them to make sure they meet criteria for the program. Thank you for helping us take better care of you by participating in Lung Screening.  Lung RADS Categories:  Lung RADS 1: no nodules  or definitely non-concerning nodules.  Recommendation is for a repeat annual scan in 12 months.  Lung RADS 2:  nodules that are non-concerning in appearance and behavior with a very low likelihood of becoming an active cancer. Recommendation is for a repeat annual scan in 12 months.  Lung RADS 3: nodules that are probably non-concerning , includes nodules with a low likelihood of becoming an active cancer.  Recommendation is for a 6-month repeat screening scan. Often noted after an upper respiratory illness. We will be in touch to make sure you have no questions, and to schedule your 6-month scan.  Lung RADS 4 A: nodules with concerning findings, recommendation is most often for a follow up scan in 3 months or additional testing based on our provider's assessment of the scan. We will be in touch to make sure you have no questions and to schedule the recommended 3 month follow up scan.  Lung RADS 4 B:  indicates findings that are concerning. We will be in touch with you to schedule additional diagnostic testing based on our provider's  assessment of the scan.   

## 2019-10-21 ENCOUNTER — Other Ambulatory Visit: Payer: Self-pay | Admitting: *Deleted

## 2019-10-21 DIAGNOSIS — Z87891 Personal history of nicotine dependence: Secondary | ICD-10-CM

## 2019-10-21 NOTE — Progress Notes (Signed)
Please call patient and let them  know their  low dose Ct was read as a Lung RADS 2: nodules that are benign in appearance and behavior with a very low likelihood of becoming a clinically active cancer due to size or lack of growth. Recommendation per radiology is for a repeat LDCT in 12 months. .Please let them  know we will order and schedule their  annual screening scan for 10/2020. Please let them  know there was notation of CAD on their  scan.  Please remind the patient  that this is a non-gated exam therefore degree or severity of disease  cannot be determined. Please have them  follow up with their PCP regarding potential risk factor modification, dietary therapy or pharmacologic therapy if clinically indicated. Pt.  is not  currently on statin therapy. Please place order for annual  screening scan for  10/2020 and fax results to PCP. Thanks so much. 

## 2019-11-22 ENCOUNTER — Telehealth: Payer: Self-pay | Admitting: Internal Medicine

## 2019-11-22 MED ORDER — VARENICLINE TARTRATE 0.5 MG PO TABS: 0.5000 mg | ORAL_TABLET | Freq: Two times a day (BID) | ORAL | 1 refills | Status: DC

## 2019-11-22 NOTE — Telephone Encounter (Signed)
Spoke w/ Adela Lank- informed main concern is fatigue. Agreed to drop dose to the 0.5mg  bid for the next month to see how Pt does. Rx sent.

## 2019-11-22 NOTE — Telephone Encounter (Signed)
Was prescribed Chantix 08-2019. If he has severe side effect needs to stop. If not, he could stay on a lower dose: 0.5 mg B.I.D..

## 2019-11-22 NOTE — Telephone Encounter (Signed)
Please advise 

## 2019-11-22 NOTE — Telephone Encounter (Signed)
Caller:Jacqueline (wife) Call back phone number: [phone]  Adela Lank states husband is on Chantix and is experiencing side of effects lethargy, some dreams, bloating, gas. She would like some guidance.  Please advise

## 2019-12-20 ENCOUNTER — Other Ambulatory Visit: Payer: Self-pay

## 2019-12-20 ENCOUNTER — Encounter: Payer: Self-pay | Admitting: Internal Medicine

## 2019-12-20 ENCOUNTER — Ambulatory Visit: Payer: 59 | Admitting: Internal Medicine

## 2019-12-20 VITALS — BP 115/68 | HR 54 | Temp 98.3°F | Resp 18 | Ht 67.0 in | Wt 224.5 lb

## 2019-12-20 DIAGNOSIS — E782 Mixed hyperlipidemia: Secondary | ICD-10-CM | POA: Diagnosis not present

## 2019-12-20 DIAGNOSIS — Z9989 Dependence on other enabling machines and devices: Secondary | ICD-10-CM

## 2019-12-20 DIAGNOSIS — I2510 Atherosclerotic heart disease of native coronary artery without angina pectoris: Secondary | ICD-10-CM | POA: Diagnosis not present

## 2019-12-20 DIAGNOSIS — G4733 Obstructive sleep apnea (adult) (pediatric): Secondary | ICD-10-CM

## 2019-12-20 DIAGNOSIS — Z720 Tobacco use: Secondary | ICD-10-CM

## 2019-12-20 NOTE — Patient Instructions (Addendum)
Continue your walking program  Try to eat feel it is okay  If you like to extend your prescription for Chantix let me know  If you like to start a medication for cholesterol: Atorvastatin 10 mg, a low-dose, let me know.  Will schedule a stress test   GO TO THE FRONT DESK, PLEASE SCHEDULE YOUR APPOINTMENTS Come back for   a checkup in 3 months

## 2019-12-20 NOTE — Progress Notes (Signed)
Pre visit review using our clinic review tool, if applicable. No additional management support is needed unless otherwise documented below in the visit note. 

## 2019-12-20 NOTE — Progress Notes (Signed)
Subjective:    Patient ID: Brent Grist., male    DOB: 29-Jun-1960, 59 y.o.   MRN: [id number]  DOS:  12/20/2019 Type of visit - description: Follow-up Since the last office visit, he saw pulmonary, notes reviewed. He quit tobacco about month ago, on Chantix. Complained about feeling lethargic fatigue, related to Chantix?. he likes to be sure his heart is okay and a lack of energy is not CV related.  Review of Systems Denies chest pain or difficulty breathing. No edema Has a start walking routine without DOE. No nausea, vomiting, diarrhea. No blood in the stools. No depression, wife has mentioned that he is "short", irritable.  Past Medical History:  Diagnosis Date  . ETOH abuse   . Gout   . History of chicken pox   . Hyperlipidemia   . Sleep apnea    on CPAP    Past Surgical History:  Procedure Laterality Date  . NO PAST SURGERIES      Allergies as of 12/20/2019      Reactions   Shellfish Allergy Anaphylaxis      Medication List       Accurate as of December 20, 2019 11:59 PM. If you have any questions, ask your nurse or doctor.        COLLAGEN EX Apply topically.   multivitamin with minerals Tabs tablet Take 1 tablet by mouth daily.   varenicline 0.5 MG tablet Commonly known as: Chantix Take 1 tablet (0.5 mg total) by mouth 2 (two) times daily.   VISION VITAMINS PO Take by mouth.   VITAMIN C PO Take by mouth.   VITAMIN D PO Take by mouth.          Objective:   Physical Exam BP 115/68 (BP Location: Right Arm, Patient Position: Sitting, Cuff Size: Normal)   Pulse (!) 54   Temp 98.3 F (36.8 C) (Oral)   Resp 18   Ht 5\' 7"  (1.702 m)   Wt 224 lb 8 oz (101.8 kg)   SpO2 94%   BMI 35.16 kg/m  General:   Well developed, NAD, BMI noted. HEENT:  Normocephalic . Face symmetric, atraumatic Lungs:  CTA B Normal respiratory effort, no intercostal retractions, no accessory muscle use. Heart: RRR,  no murmur.  Lower extremities: no pretibial  edema bilaterally  Skin: Not pale. Not jaundice Neurologic:  alert & oriented X3.  Speech normal, gait appropriate for age and unassisted Psych--  Cognition and judgment appear intact.  Cooperative with normal attention span and concentration.  Behavior appropriate. No anxious or depressed appearing.      Assessment      ASSESSMENT EtOH abuse, still drinks during the weekends  Sleep apnea, on CPAP History of gout, at some point was on allopurinol Coronary arteriosclerosis per CT 10/2019 Emphysema per CT 10/2019  Plan: Tobacco abuse: on  Chantix, stopped smoking 4 weeks ago.  Praised! I if he likes to extend Chantix for few weeks he will reach out to me. Sleep apnea: On CPAP Coronary atherosclerosis: Per CT chest 10/2019. He complains of fatigue, he has smoked for years, he is concerned about his heart. EKG today: Sinus bradycardia, no acute changes Plan: Myoview, avoid treadmill because he just started a walking program and is probably deconditioned Emphysema: Per CT, no symptoms. Dyslipidemia: Mild, CV RF 11.4%, we talk about possibly statins, low-dose. He would like to think about it and let me know. RTC 3 months    This visit occurred during the SARS-CoV-2  public health emergency.  Safety protocols were in place, including screening questions prior to the visit, additional usage of staff PPE, and extensive cleaning of exam room while observing appropriate contact time as indicated for disinfecting solutions.

## 2019-12-21 ENCOUNTER — Other Ambulatory Visit: Payer: Self-pay | Admitting: Internal Medicine

## 2019-12-21 DIAGNOSIS — I2510 Atherosclerotic heart disease of native coronary artery without angina pectoris: Secondary | ICD-10-CM | POA: Insufficient documentation

## 2019-12-21 NOTE — Assessment & Plan Note (Signed)
Tobacco abuse: on  Chantix, stopped smoking 4 weeks ago.  Praised! I if he likes to extend Chantix for few weeks he will reach out to me. Sleep apnea: On CPAP Coronary atherosclerosis: Per CT chest 10/2019. He complains of fatigue, he has smoked for years, he is concerned about his heart. EKG today: Sinus bradycardia, no acute changes Plan: Myoview, avoid treadmill because he just started a walking program and is probably deconditioned Emphysema: Per CT, no symptoms. Dyslipidemia: Mild, CV RF 11.4%, we talk about possibly statins, low-dose. He would like to think about it and let me know. RTC 3 months

## 2020-01-03 ENCOUNTER — Telehealth: Payer: Self-pay

## 2020-01-03 DIAGNOSIS — R5383 Other fatigue: Secondary | ICD-10-CM

## 2020-01-03 NOTE — Telephone Encounter (Signed)
Pt's wife here today to see Dr. Drue Novel- she mentions that Pt is still experiencing fatigue, wants to know if his testosterone can be checked. Requesting to be done either here or a referral to Endo.

## 2020-01-03 NOTE — Telephone Encounter (Signed)
Order placed and mychart message sent to Pt.

## 2020-01-03 NOTE — Telephone Encounter (Signed)
Okay, schedule free and total testosterone, DX fatigue. Needs to be a early sample at 8 AM. Please make him aware that his testosterone is mildly decreased, that is unlikely to explain fatigue.

## 2020-01-13 ENCOUNTER — Other Ambulatory Visit: Payer: Self-pay

## 2020-01-13 ENCOUNTER — Other Ambulatory Visit (INDEPENDENT_AMBULATORY_CARE_PROVIDER_SITE_OTHER): Payer: 59

## 2020-01-13 DIAGNOSIS — R5383 Other fatigue: Secondary | ICD-10-CM | POA: Diagnosis not present

## 2020-01-14 LAB — TESTOSTERONE TOTAL,FREE,BIO, MALES
Albumin: 4.1 g/dL (ref 3.6–5.1)
Sex Hormone Binding: 28 nmol/L (ref 22–77)
Testosterone, Bioavailable: 77.4 ng/dL — ABNORMAL LOW (ref >=110.0)
Testosterone, Free: 41.1 pg/mL — ABNORMAL LOW (ref 46.0–224.0)
Testosterone: 279 ng/dL (ref 250–827)

## 2020-01-20 ENCOUNTER — Telehealth (HOSPITAL_COMMUNITY): Payer: Self-pay | Admitting: *Deleted

## 2020-01-20 NOTE — Telephone Encounter (Signed)
Left message on voicemail per DPR in reference to upcoming appointment scheduled on 01/26/20 with detailed instructions given per Myocardial Perfusion Study Information Sheet for the test. LM to arrive 15 minutes early, and that it is imperative to arrive on time for appointment to keep from having the test rescheduled. If you need to cancel or reschedule your appointment, please call the office within 24 hours of your appointment. Failure to do so may result in a cancellation of your appointment, and a $50 no show fee. Phone number given for call back for any questions. Brent Scott    

## 2020-01-26 ENCOUNTER — Other Ambulatory Visit: Payer: Self-pay

## 2020-01-26 ENCOUNTER — Ambulatory Visit (HOSPITAL_COMMUNITY): Payer: 59 | Attending: Internal Medicine

## 2020-01-26 VITALS — Ht 67.0 in | Wt 224.0 lb

## 2020-01-26 DIAGNOSIS — I2510 Atherosclerotic heart disease of native coronary artery without angina pectoris: Secondary | ICD-10-CM | POA: Diagnosis not present

## 2020-01-26 LAB — MYOCARDIAL PERFUSION IMAGING
LV dias vol: 124 mL (ref 62–150)
LV sys vol: 51 mL
Peak HR: 79 {beats}/min
Rest HR: 48 {beats}/min
SDS: 2
SRS: 0
SSS: 2
TID: 1.03

## 2020-01-26 MED ORDER — REGADENOSON 0.4 MG/5ML IV SOLN
0.4000 mg | Freq: Once | INTRAVENOUS | Status: AC
  Administered 2020-01-26: 0.4 mg via INTRAVENOUS

## 2020-01-26 MED ORDER — TECHNETIUM TC 99M TETROFOSMIN IV KIT
10.4000 | PACK | Freq: Once | INTRAVENOUS | Status: AC | PRN
  Administered 2020-01-26: 10.4 via INTRAVENOUS
  Filled 2020-01-26: qty 11

## 2020-01-26 MED ORDER — TECHNETIUM TC 99M TETROFOSMIN IV KIT
31.4000 | PACK | Freq: Once | INTRAVENOUS | Status: AC | PRN
  Administered 2020-01-26: 31.4 via INTRAVENOUS
  Filled 2020-01-26: qty 32

## 2020-03-27 ENCOUNTER — Ambulatory Visit: Payer: 59 | Admitting: Internal Medicine

## 2020-07-26 ENCOUNTER — Other Ambulatory Visit: Payer: Self-pay

## 2020-07-26 ENCOUNTER — Ambulatory Visit: Payer: 59 | Admitting: Internal Medicine

## 2020-07-26 ENCOUNTER — Encounter: Payer: Self-pay | Admitting: Internal Medicine

## 2020-07-26 VITALS — BP 126/72 | HR 53 | Temp 98.2°F | Resp 16 | Ht 67.0 in | Wt 225.0 lb

## 2020-07-26 DIAGNOSIS — H6980 Other specified disorders of Eustachian tube, unspecified ear: Secondary | ICD-10-CM

## 2020-07-26 NOTE — Progress Notes (Unsigned)
Pre visit review using our clinic review tool, if applicable. No additional management support is needed unless otherwise documented below in the visit note. 

## 2020-07-26 NOTE — Progress Notes (Signed)
   Subjective:    Patient ID: Brent Grist., male    DOB: 1960-09-15, 60 y.o.   MRN: [id number]  DOS:  07/26/2020 Type of visit - description: Acute 2-week history of left ear discomfort, "like fluid is in there", Denies any recent URI, no postnasal drip no nasal discharge No actual ear discharge or difficulty hearing.   Review of Systems See above   Past Medical History:  Diagnosis Date  . ETOH abuse   . Gout   . History of chicken pox   . Hyperlipidemia   . Sleep apnea    on CPAP    Past Surgical History:  Procedure Laterality Date  . NO PAST SURGERIES      Allergies as of 07/26/2020      Reactions   Shellfish Allergy Anaphylaxis      Medication List       Accurate as of July 26, 2020  9:57 AM. If you have any questions, ask your nurse or doctor.        Chantix 0.5 MG tablet Generic drug: varenicline TAKE 1 TABLET BY MOUTH 2 TIMES DAILY.   COLLAGEN EX Apply topically.   multivitamin with minerals Tabs tablet Take 1 tablet by mouth daily.   VISION VITAMINS PO Take by mouth.   VITAMIN C PO Take by mouth.   VITAMIN D PO Take by mouth.          Objective:   Physical Exam BP 126/72 (BP Location: Left Arm, Patient Position: Sitting, Cuff Size: Normal)   Pulse (!) 53   Temp 98.2 F (36.8 C) (Oral)   Resp 16   Ht 5\' 7"  (1.702 m)   Wt 225 lb (102.1 kg)   SpO2 98%   BMI 35.24 kg/m  General:   Well developed, NAD, BMI noted. HEENT:  Normocephalic . Face symmetric, atraumatic Left TM: Normal, canal normal.  Small amount of wax noted Right TM: Normal, canal normal Throat: Symmetric, no lesions. Skin: Not pale. Not jaundice Neurologic:  alert & oriented X3.  Speech normal, gait appropriate for age and unassisted Psych--  Cognition and judgment appear intact.  Cooperative with normal attention span and concentration.  Behavior appropriate. No anxious or depressed appearing.      Assessment       ASSESSMENT EtOH abuse, still  drinks during the weekends  Sleep apnea, on CPAP History of gout, at some point was on allopurinol Coronary arteriosclerosis per CT 10/2019.  Myoview 01-2020: Low risk Emphysema per CT 10/2019  Plan: ET dysfunction?  No evidence of infection or any serious issues, recommend Flonase for 2 to 3 weeks, if not better consider further eval. Coronary atherosclerosis: Myoview 01/2020: Low risk Growth hormone: Started to take a growth hormone recently, states she is feeling great, Rx by Dr 02/2020 at [facility] Preventive care: Concerned about a colonoscopy, he will be due in 2023.  Had COVID vaccines. Recommend a CPX at his earliest convenience.      This visit occurred during the SARS-CoV-2 public health emergency.  Safety protocols were in place, including screening questions prior to the visit, additional usage of staff PPE, and extensive cleaning of exam room while observing appropriate contact time as indicated for disinfecting solutions.

## 2020-07-26 NOTE — Patient Instructions (Signed)
Use Flonase OTC 2 sprays on each side of the nose at night x 2-3 weeks   Schedule a physical at your convenience

## 2020-07-27 NOTE — Assessment & Plan Note (Signed)
ET dysfunction?  No evidence of infection or any serious issues, recommend Flonase for 2 to 3 weeks, if not better consider further eval. Coronary atherosclerosis: Myoview 01/2020: Low risk Growth hormone: Started to take a growth hormone recently, states she is feeling great, Rx by Dr Laruth Bouchard at [facility] Preventive care: Concerned about a colonoscopy, he will be due in 2023.  Had COVID vaccines. Recommend a CPX at his earliest convenience.

## 2020-08-18 ENCOUNTER — Encounter: Payer: Self-pay | Admitting: Internal Medicine

## 2021-08-10 ENCOUNTER — Encounter: Payer: Self-pay | Admitting: Internal Medicine

## 2021-10-10 ENCOUNTER — Encounter: Payer: Self-pay | Admitting: Nurse Practitioner

## 2021-10-10 ENCOUNTER — Ambulatory Visit: Payer: 59 | Admitting: Nurse Practitioner

## 2021-10-10 DIAGNOSIS — Z9989 Dependence on other enabling machines and devices: Secondary | ICD-10-CM | POA: Diagnosis not present

## 2021-10-10 DIAGNOSIS — G4733 Obstructive sleep apnea (adult) (pediatric): Secondary | ICD-10-CM | POA: Diagnosis not present

## 2021-10-10 DIAGNOSIS — Z720 Tobacco use: Secondary | ICD-10-CM | POA: Diagnosis not present

## 2021-10-10 NOTE — Progress Notes (Signed)
? ?@Patient  ID: ., male    DOB: 03-Dec-1960, 61 y.o.   MRN: [id number] ? ?Chief Complaint  ?Patient presents with  ? Follow-up  ?  Pt was seen by [phone] in 2021. Has his CT done on 10/2019. Pt states he wears his CPAP every night with no issues noted.   ? ? ?Referring provider: ?11/2019, MD ? ?HPI: ?61 year old male, former smoker (30 pack years) followed for OSA on CPAP and history of smoker's cough.  He is a patient Dr. 67 and last seen in office on 08/18/2019.  He was then seen by 10/18/2019 NP for shared decision visit lung cancer screening on 10/20/2019.  Past medical history significant for atherosclerosis, gout, HLD, low back pain. ? ?TEST/EVENTS:  ?10/20/2019 lung cancer screening CT: Atherosclerosis is present.  There is no LAD.  There is mild central lobular emphysema.  There are 2 small solid scattered pulmonary nodules, largest 4.1 mm in volume.  Lung RADS 2 ? ?08/18/2019: OV with Dr. 10/18/2019.  Presented to establish care for OSA.  He is on CPAP therapy 11 cmH2O with good compliance; download reviewed and increased to 12 cm of water to cut out snoring.  Notify in 1 month if this has resolved symptoms.  Has a chronic smoker's cough.  Smoking cessation strongly advised. ? ?10/10/2021: Today-follow-up ?Patient presents today for overdue follow-up of OSA.  He continues to receive good benefit from use and wears it nightly.  Does not have any issues or concerns aside from occasional mask leaks.  Denies any fatigue, drowsy driving, morning headaches, narcolepsy.  Breathing overall stable.  He did undergo lung cancer screening CT in May 2021 and has not had one since.  Denies any hemoptysis, weight loss, anorexia.  Quit smoking since we saw him last. ? ?07/12/2021-10/09/2021 ?[facility] download: CPAP 12 cmH2O ?89 out of 90 days used; 98% greater than 4 hours; average usage 8 hours 28 minutes ?Leaks median 5.3, 95th 37.3 ?AHI 1.3 ? ?Allergies  ?Allergen Reactions  ? Shellfish Allergy Anaphylaxis   ? ? ?Immunization History  ?Administered Date(s) Administered  ? Influenza,inj,Quad PF,6+ Mos 05/10/2020  ? Influenza-Unspecified 03/11/2019  ? PFIZER(Purple Top)SARS-COV-2 Vaccination 09/10/2019, 10/01/2019, 05/10/2020  ? Tdap 06/22/2019  ? ? ?Past Medical History:  ?Diagnosis Date  ? ETOH abuse   ? Gout   ? History of chicken pox   ? Hyperlipidemia   ? Sleep apnea   ? on CPAP  ? ? ?Tobacco History: ?Social History  ? ?Tobacco Use  ?Smoking Status Former  ? Packs/day: 0.75  ? Years: 40.00  ? Pack years: 30.00  ? Types: Cigarettes  ? Start date: 1970  ? Quit date: 2020  ? Years since quitting: 3.3  ? Passive exposure: Past  ?Smokeless Tobacco Never  ? ?Counseling given: Not Answered ? ? ?Outpatient Medications Prior to Visit  ?Medication Sig Dispense Refill  ? Ascorbic Acid (VITAMIN C PO) Take by mouth.    ? Multiple Vitamin (MULTIVITAMIN WITH MINERALS) TABS tablet Take 1 tablet by mouth daily.    ? Multiple Vitamins-Minerals (VISION VITAMINS PO) Take by mouth.    ? VITAMIN D PO Take by mouth.    ? CHANTIX 0.5 MG tablet TAKE 1 TABLET BY MOUTH 2 TIMES DAILY. (Patient not taking: Reported on 07/26/2020) 60 tablet 1  ? Emollient (COLLAGEN EX) Apply topically.    ? ?No facility-administered medications prior to visit.  ? ? ? ?Review of Systems:  ? ?Constitutional: No weight loss  or gain, night sweats, fevers, chills, fatigue, or lassitude. ?HEENT: No headaches, difficulty swallowing, tooth/dental problems, or sore throat. No sneezing, itching, ear ache, nasal congestion, or post nasal drip ?CV:  No chest pain, orthopnea, PND, swelling in lower extremities, anasarca, dizziness, palpitations, syncope ?Resp: No shortness of breath with exertion or at rest. No excess mucus or change in color of mucus. No productive or non-productive. No hemoptysis. No wheezing.  No chest wall deformity ?GI:  No heartburn, indigestion, abdominal pain, nausea, vomiting, diarrhea, change in bowel habits, loss of appetite, bloody stools.   ?Skin: No rash, lesions, ulcerations ?MSK:  No joint pain or swelling.  No decreased range of motion.  No back pain. ?Neuro: No dizziness or lightheadedness.  ?Psych: No depression or anxiety. Mood stable.  ? ? ? ?Physical Exam: ? ?BP 116/62 (BP Location: Left Arm, Patient Position: Sitting, Cuff Size: Normal)   Pulse 60   Temp 98 ?F (36.7 ?C) (Oral)   Ht 5\' 6"  (1.676 m)   Wt 220 lb (99.8 kg)   SpO2 95%   BMI 35.51 kg/m?  ? ?GEN: Pleasant, interactive, well-appearing; in no acute distress. ?HEENT:  Normocephalic and atraumatic. PERRLA. Sclera white. Nasal turbinates pink, moist and patent bilaterally. No rhinorrhea present. Oropharynx pink and moist, without exudate or edema. No lesions, ulcerations, or postnasal drip.  ?NECK:  Supple w/ fair ROM. No JVD present. Normal carotid impulses w/o bruits. Thyroid symmetrical with no goiter or nodules palpated. No lymphadenopathy.   ?CV: RRR, no m/r/g, no peripheral edema. Pulses intact, +2 bilaterally. No cyanosis, pallor or clubbing. ?PULMONARY:  Unlabored, regular breathing. Clear bilaterally A&P w/o wheezes/rales/rhonchi. No accessory muscle use. No dullness to percussion. ?GI: BS present and normoactive. Soft, non-tender to palpation. No organomegaly or masses detected. No CVA tenderness. ?MSK: No erythema, warmth or tenderness. Cap refil <2 sec all extrem. No deformities or joint swelling noted.  ?Neuro: A/Ox3. No focal deficits noted.   ?Skin: Warm, no lesions or rashe ?Psych: Normal affect and behavior. Judgement and thought content appropriate.  ? ? ? ?Lab Results: ? ?CBC ?   ?Component Value Date/Time  ? WBC 8.8 06/22/2019 1608  ? RBC 4.67 06/22/2019 1608  ? HGB 14.0 06/22/2019 1608  ? HCT 41.5 06/22/2019 1608  ? PLT 180.0 06/22/2019 1608  ? MCV 88.8 06/22/2019 1608  ? MCHC 33.7 06/22/2019 1608  ? RDW 13.2 06/22/2019 1608  ? ? ?BMET ?   ?Component Value Date/Time  ? NA 139 06/22/2019 1608  ? K 4.0 06/22/2019 1608  ? CL 104 06/22/2019 1608  ? CO2 26  06/22/2019 1608  ? GLUCOSE 105 (H) 06/22/2019 1608  ? BUN 18 06/22/2019 1608  ? CREATININE 1.29 06/22/2019 1608  ? CALCIUM 10.0 06/22/2019 1608  ? ? ?BNP ?No results found for: BNP ? ? ?Imaging: ? ?No results found. ? ? ? ?   ? View : No data to display.  ?  ?  ?  ? ? ?No results found for: NITRICOXIDE ? ? ? ? ? ?Assessment & Plan:  ? ?OSA on CPAP ?Excellent compliance and continues to receive good benefit.  Does have some occasional significant mask leaks; does not seem to bother him and AHI is well-controlled at 1.3.  Advised him to notify us if he would like us to make adjustments and see if this makes a difference or send him for mask fitting.  Verbalized understanding ? ?Patient Instructions  ?Continue to use CPAP every night, minimum of 4-6 hours  a night.  ?Change equipment every 30 days or as directed by DME. Wash your tubing with warm soap and water daily, hang to dry. Wash humidifier portion weekly.  ?Be aware of reduced alertness and do not drive or operate heavy machinery if experiencing this or drowsiness.  ?Exercise encouraged, as tolerated. ?Avoid or decrease alcohol consumption and medications that make you more sleepy, if possible. ?Notify if persistent daytime sleepiness occurs even with consistent use of CPAP. ? ?Someone from the lung cancer screening program will contact you to get your CT scan set up. ? ?Follow up in one year with Dr. Vassie Loll. If symptoms do not improve or worsen, please contact office for sooner follow up or seek emergency care. ? ? ? ? ?Tobacco abuse ?Has quit smoking since we saw him last.  Last LDCT for lung cancer screening was in May 2021 with 2 small pulmonary nodules, lung RADS 2..  Message sent to RN with program to get patient set up for repeat annual scan. ? ? ?I spent 25 minutes of dedicated to the care of this patient on the date of this encounter to include pre-visit review of records, face-to-face time with the patient discussing conditions above, post visit ordering  of testing, clinical documentation with the electronic health record, making appropriate referrals as documented, and communicating necessary findings to members of the patients care team. ? ?Noemi Chapel, NP ?10/10/2021 ? ?Pt awar

## 2021-10-10 NOTE — Assessment & Plan Note (Signed)
Has quit smoking since we saw him last.  Last LDCT for lung cancer screening was in May 2021 with 2 small pulmonary nodules, lung RADS 2..  Message sent to RN with program to get patient set up for repeat annual scan. ?

## 2021-10-10 NOTE — Assessment & Plan Note (Signed)
Excellent compliance and continues to receive good benefit.  Does have some occasional significant mask leaks; does not seem to bother him and AHI is well-controlled at 1.3.  Advised him to notify us if he would like [place] to make adjustments and see if this makes a difference or send him for mask fitting.  Verbalized understanding ? ?Patient Instructions  ?Continue to use CPAP every night, minimum of 4-6 hours a night.  ?Change equipment every 30 days or as directed by DME. Wash your tubing with warm soap and water daily, hang to dry. Wash humidifier portion weekly.  ?Be aware of reduced alertness and do not drive or operate heavy machinery if experiencing this or drowsiness.  ?Exercise encouraged, as tolerated. ?Avoid or decrease alcohol consumption and medications that make you more sleepy, if possible. ?Notify if persistent daytime sleepiness occurs even with consistent use of CPAP. ? ?Someone from the lung cancer screening program will contact you to get your CT scan set up. ? ?Follow up in one year with Dr. Elsworth Soho. If symptoms do not improve or worsen, please contact office for sooner follow up or seek emergency care. ? ? ? ?

## 2021-10-10 NOTE — Patient Instructions (Addendum)
Continue to use CPAP every night, minimum of 4-6 hours a night.  ?Change equipment every 30 days or as directed by DME. Wash your tubing with warm soap and water daily, hang to dry. Wash humidifier portion weekly.  ?Be aware of reduced alertness and do not drive or operate heavy machinery if experiencing this or drowsiness.  ?Exercise encouraged, as tolerated. ?Avoid or decrease alcohol consumption and medications that make you more sleepy, if possible. ?Notify if persistent daytime sleepiness occurs even with consistent use of CPAP. ? ?Someone from the lung cancer screening program will contact you to get your CT scan set up. ? ?Follow up in one year with Dr. Vassie Loll. If symptoms do not improve or worsen, please contact office for sooner follow up or seek emergency care. ? ?

## 2021-10-12 ENCOUNTER — Other Ambulatory Visit: Payer: Self-pay | Admitting: *Deleted

## 2021-10-12 DIAGNOSIS — Z122 Encounter for screening for malignant neoplasm of respiratory organs: Secondary | ICD-10-CM

## 2021-10-12 DIAGNOSIS — Z87891 Personal history of nicotine dependence: Secondary | ICD-10-CM

## 2021-10-17 ENCOUNTER — Ambulatory Visit (HOSPITAL_BASED_OUTPATIENT_CLINIC_OR_DEPARTMENT_OTHER)
Admission: RE | Admit: 2021-10-17 | Discharge: 2021-10-17 | Disposition: A | Discharge status: Home / Self Care | Payer: 59 | Source: Ambulatory Visit | Attending: Acute Care | Admitting: Acute Care

## 2021-10-17 DIAGNOSIS — Z87891 Personal history of nicotine dependence: Secondary | ICD-10-CM

## 2021-10-17 DIAGNOSIS — Z122 Encounter for screening for malignant neoplasm of respiratory organs: Secondary | ICD-10-CM

## 2021-10-22 ENCOUNTER — Other Ambulatory Visit: Payer: Self-pay

## 2021-10-22 DIAGNOSIS — Z122 Encounter for screening for malignant neoplasm of respiratory organs: Secondary | ICD-10-CM

## 2021-10-22 DIAGNOSIS — Z87891 Personal history of nicotine dependence: Secondary | ICD-10-CM

## 2021-12-12 ENCOUNTER — Ambulatory Visit (INDEPENDENT_AMBULATORY_CARE_PROVIDER_SITE_OTHER): Payer: 59 | Admitting: Internal Medicine

## 2021-12-12 ENCOUNTER — Encounter: Payer: Self-pay | Admitting: Internal Medicine

## 2021-12-12 VITALS — BP 124/72 | HR 57 | Resp 18 | Ht 67.0 in | Wt 230.8 lb

## 2021-12-12 DIAGNOSIS — Z0000 Encounter for general adult medical examination without abnormal findings: Secondary | ICD-10-CM | POA: Diagnosis not present

## 2021-12-12 DIAGNOSIS — I2510 Atherosclerotic heart disease of native coronary artery without angina pectoris: Secondary | ICD-10-CM

## 2021-12-12 DIAGNOSIS — Z114 Encounter for screening for human immunodeficiency virus [HIV]: Secondary | ICD-10-CM

## 2021-12-12 DIAGNOSIS — Z1159 Encounter for screening for other viral diseases: Secondary | ICD-10-CM | POA: Diagnosis not present

## 2021-12-12 DIAGNOSIS — E782 Mixed hyperlipidemia: Secondary | ICD-10-CM

## 2021-12-12 DIAGNOSIS — Z1211 Encounter for screening for malignant neoplasm of colon: Secondary | ICD-10-CM

## 2021-12-12 MED ORDER — SHINGRIX 50 MCG/0.5ML IM SUSR: 0.5000 mL | Freq: Once | INTRAMUSCULAR | 1 refills | Status: AC

## 2021-12-12 NOTE — Progress Notes (Unsigned)
   Subjective:    Patient ID: Brent Grist., male    DOB: 31-Jan-1961, 61 y.o.   MRN: [id number]  DOS:  12/12/2021 Type of visit - description: CPX  Here for CPX, has no major concerns.   Review of Systems See above   Past Medical History:  Diagnosis Date   ETOH abuse    Gout    History of chicken pox    Hyperlipidemia    Sleep apnea    on CPAP    Past Surgical History:  Procedure Laterality Date   NO PAST SURGERIES      Current Outpatient Medications  Medication Instructions   Multiple Vitamin (MULTIVITAMIN WITH MINERALS) TABS tablet 1 tablet, Oral, Daily   TESTOSTERONE IM Intramuscular   VITAMIN D PO Oral       Objective:   Physical Exam BP 124/72 (BP Location: Left Arm, Patient Position: Sitting, Cuff Size: Normal)   Pulse (!) 57   Resp 18   Ht 5\' 7"  (1.702 m)   Wt 230 lb 12.8 oz (104.7 kg)   SpO2 97%   BMI 36.15 kg/m  General: Well developed, NAD, BMI noted Neck: No  thyromegaly  HEENT:  Normocephalic . Face symmetric, atraumatic Lungs:  CTA B Normal respiratory effort, no intercostal retractions, no accessory muscle use. Heart: RRR,  no murmur.  Abdomen:  Not distended, soft, non-tender. No rebound or rigidity.   Lower extremities: no pretibial edema bilaterally  Skin: Exposed areas without rash. Not pale. Not jaundice DRE: Normal sphincter tone, brown stools, prostate Neurologic:  alert & oriented X3.  Speech normal, gait appropriate for age and unassisted Strength symmetric and appropriate for age.  Psych: Cognition and judgment appear intact.  Cooperative with normal attention span and concentration.  Behavior appropriate. No anxious or depressed appearing.     Assessment       ASSESSMENT EtOH abuse, still drinks during the weekends  Sleep apnea, on CPAP History of gout, at some point was on allopurinol Coronary arteriosclerosis per CT 10/2019.  Myoview 01-2020: Low risk Emphysema per CT 10/2019 Hypogonadism (HRT per health  co[facility])   PLAN Here for CPX EtOH: History of abuse, drinks sometimes daily, sometimes more than 2 servings daily, the patient does not feel he has a problem.  Recommend moderation or abstinence if possible. Coronary atherosclerosis per CT: Plan is to control CV RF, last LDL was 121, with talk about possibly OSA, COPD: Saw pulm 10-2021 Hypogonadism, HRT: Managed elsewhere.  -Tdap: 2021 - shingrix d/w pt  - covid vax x 4, rec bivalent  -Had a flu shot -2022 02-2012, diverticulosis, internal hemorrhoids, recheck in 10 years.  See report -Prostate cancer screening: DRE normal, check a PSA -Diet, exercise:   -Lung cancer screening: last CT 10-2021 -Diet exercise discussed -Labs: CMP, FLP, CBC, TSH, PSA, A1c, HIV, hep C -POA information provided The 10-year ASCVD risk score (Arnett DK, et al., 2019) is: 9.3%   Values used to calculate the score:     Age: 4 years     Sex: Male     Is Non-Hispanic African American: No     Diabetic: No     Tobacco smoker: No     Systolic Blood Pressure: 124 mmHg     Is BP treated: No     HDL Cholesterol: 51.6 mg/dL     Total Cholesterol: 216 mg/dL

## 2021-12-12 NOTE — Patient Instructions (Addendum)
Vaccines I recommend: Shingrix COVID booster Flu shot this fall  GO TO THE LAB : Get the blood work    Call the gastroenterology office for a colonoscopy: [phone]  GO TO THE FRONT DESK, PLEASE SCHEDULE YOUR APPOINTMENTS Come back for   a physical exam in 1 year     "Living will", "Health Care Power of attorney": Advanced care planning  (If you already have a living will or healthcare power of attorney, please bring the copy to be scanned in your chart.)  Advance care planning is a process that supports adults in  understanding and sharing their preferences regarding future medical care.   The patient's preferences are recorded in documents called Advance Directives.    Advanced directives are completed (and can be modified at any time) while the patient is in full mental capacity.   The documentation should be available at all times to the patient, the family and the healthcare providers.  Bring in a copy to be scanned in your chart is an excellent idea and is recommended   This legal documents direct treatment decision making and/or appoint a surrogate to make the decision if the patient is not capable to do so.    Advance directives can be documented in many types of formats,  documents have names such as:  Lliving will  Durable power of attorney for healthcare (healthcare proxy or healthcare power of attorney)  Combined directives  Physician orders for life-sustaining treatment    More information at:  [email or web address]

## 2021-12-13 ENCOUNTER — Encounter: Payer: Self-pay | Admitting: Internal Medicine

## 2021-12-13 LAB — COMPREHENSIVE METABOLIC PANEL
ALT: 21 U/L (ref 0–53)
AST: 20 U/L (ref 0–37)
Albumin: 4.4 g/dL (ref 3.5–5.2)
Alkaline Phosphatase: 26 U/L — ABNORMAL LOW (ref 39–117)
BUN: 20 mg/dL (ref 6–23)
CO2: 28 mEq/L (ref 19–32)
Calcium: 9.6 mg/dL (ref 8.4–10.5)
Chloride: 104 mEq/L (ref 96–112)
Creatinine, Ser: 1.08 mg/dL (ref 0.40–1.50)
GFR: 74.31 mL/min (ref >60.00)
Glucose, Bld: 95 mg/dL (ref 70–99)
Potassium: 4.1 mEq/L (ref 3.5–5.1)
Sodium: 140 mEq/L (ref 135–145)
Total Bilirubin: 0.4 mg/dL (ref 0.2–1.2)
Total Protein: 6.3 g/dL (ref 6.0–8.3)

## 2021-12-13 LAB — CBC WITH DIFFERENTIAL/PLATELET
Basophils Absolute: 0 10*3/uL (ref 0.0–0.1)
Basophils Relative: 0.6 % (ref 0.0–3.0)
Eosinophils Absolute: 0.7 10*3/uL (ref 0.0–0.7)
Eosinophils Relative: 10.6 % — ABNORMAL HIGH (ref 0.0–5.0)
HCT: 45.1 % (ref 39.0–52.0)
Hemoglobin: 14.9 g/dL (ref 13.0–17.0)
Lymphocytes Relative: 34.7 % (ref 12.0–46.0)
Lymphs Abs: 2.4 10*3/uL (ref 0.7–4.0)
MCHC: 33.1 g/dL (ref 30.0–36.0)
MCV: 90 fl (ref 78.0–100.0)
Monocytes Absolute: 0.6 10*3/uL (ref 0.1–1.0)
Monocytes Relative: 8.8 % (ref 3.0–12.0)
Neutro Abs: 3.2 10*3/uL (ref 1.4–7.7)
Neutrophils Relative %: 45.3 % (ref 43.0–77.0)
Platelets: 162 10*3/uL (ref 150.0–400.0)
RBC: 5.01 Mil/uL (ref 4.22–5.81)
RDW: 14 % (ref 11.5–15.5)
WBC: 7 10*3/uL (ref 4.0–10.5)

## 2021-12-13 LAB — HIV ANTIBODY (ROUTINE TESTING W REFLEX): HIV 1&2 Ab, 4th Generation: NONREACTIVE

## 2021-12-13 LAB — HEPATITIS C ANTIBODY: Hepatitis C Ab: NONREACTIVE

## 2021-12-13 LAB — LIPID PANEL
Cholesterol: 184 mg/dL (ref 0–200)
HDL: 56.1 mg/dL (ref >39.00)
NonHDL: 128.09
Total CHOL/HDL Ratio: 3
Triglycerides: 216 mg/dL — ABNORMAL HIGH (ref 0.0–149.0)
VLDL: 43.2 mg/dL — ABNORMAL HIGH (ref 0.0–40.0)

## 2021-12-13 LAB — LDL CHOLESTEROL, DIRECT: Direct LDL: 108 mg/dL

## 2021-12-13 LAB — TSH: TSH: 0.99 u[IU]/mL (ref 0.35–5.50)

## 2021-12-13 LAB — PSA: PSA: 1.41 ng/mL (ref 0.10–4.00)

## 2021-12-13 LAB — HEMOGLOBIN A1C: Hgb A1c MFr Bld: 5.8 % (ref 4.6–6.5)

## 2021-12-13 NOTE — Assessment & Plan Note (Signed)
Here for CPX EtOH: History of abuse, drinks sometimes daily, sometimes more than 2 servings daily, the patient does not feel he has a problem.  Rec moderation or abstinence if possible. Coronary atherosclerosis per CT: Plan is to control CV RF, last LDL was 121, d/w pt possibility of starting statins.  He is open to the idea.  Would like a low-dose OSA, COPD: Saw pulm 10-2021 Hypogonadism, HRT: Managed elsewhere. RTC 1 year

## 2021-12-13 NOTE — Assessment & Plan Note (Addendum)
-  Tdap: 2021 - shingrix d/w pt  - covid vax x 4, rec bivalent  -YME:BRAXENMMHWK 02-2012, due for recheck, GI referral sent  -Prostate cancer screening: DRE normal, check a PSA -Diet, exercise:   -Lung cancer screening: last CT 10-2021 -Diet exercise discussed -Labs: CMP, FLP, CBC, TSH, PSA, A1c, HIV, hep C -POA information provided

## 2022-07-03 ENCOUNTER — Encounter: Payer: Self-pay | Admitting: Gastroenterology

## 2022-07-19 ENCOUNTER — Telehealth: Payer: Self-pay | Admitting: *Deleted

## 2022-07-19 ENCOUNTER — Ambulatory Visit (AMBULATORY_SURGERY_CENTER): Payer: 59 | Admitting: *Deleted

## 2022-07-19 VITALS — Ht 66.5 in | Wt 215.0 lb

## 2022-07-19 DIAGNOSIS — Z1211 Encounter for screening for malignant neoplasm of colon: Secondary | ICD-10-CM

## 2022-07-19 MED ORDER — NA SULFATE-K SULFATE-MG SULF 17.5-3.13-1.6 GM/177ML PO SOLN: 1.0000 | Freq: Once | ORAL | 0 refills | Status: AC

## 2022-07-19 NOTE — Progress Notes (Signed)
No egg or soy allergy known to patient  No issues known to pt with past sedation with any surgeries or procedures Patient denies ever being intubated No issues moving head or neck No issues swallowing No FH of Malignant Hyperthermia Pt is not on diet pills Pt is not on  home 02  Pt is not on blood thinners  Pt denies issues with constipation  Pt is not on dialysis Pt denies any upcoming cardiac testing Pt encouraged to use to use Singlecare or Goodrx to reduce cost  Patient's chart reviewed by Osvaldo Angst CNRA prior to previsit and patient appropriate for the [place].  Previsit completed and red dot placed by patient's name on their procedure day (on provider's schedule).  . Visigt by phone Instructions reviewed with pt and pt states understanding. Instructed to review again prior to procedure. Pt states they will.  Instructions senty by mail

## 2022-07-19 NOTE — Telephone Encounter (Signed)
Attempt to reach pt for appt. LM with call back #

## 2022-07-29 ENCOUNTER — Encounter: Payer: Self-pay | Admitting: Gastroenterology

## 2022-08-09 ENCOUNTER — Ambulatory Visit (AMBULATORY_SURGERY_CENTER): Payer: 59 | Admitting: Gastroenterology

## 2022-08-09 ENCOUNTER — Encounter: Payer: Self-pay | Admitting: Gastroenterology

## 2022-08-09 VITALS — BP 114/57 | HR 53 | Temp 98.0°F | Resp 11 | Ht 66.5 in | Wt 215.0 lb

## 2022-08-09 DIAGNOSIS — Z1211 Encounter for screening for malignant neoplasm of colon: Secondary | ICD-10-CM

## 2022-08-09 MED ORDER — SODIUM CHLORIDE 0.9 % IV SOLN: 500.0000 mL | Freq: Once | INTRAVENOUS | Status: DC

## 2022-08-09 NOTE — Progress Notes (Signed)
History and Physical:  This patient presents for endoscopic testing for: Encounter Diagnosis  Name Primary?   Special screening for malignant neoplasms, colon Yes    No polyps [place] Patient denies chronic abdominal pain, rectal bleeding, constipation or diarrhea.   Patient is otherwise without complaints or active issues today.   Past Medical History: Past Medical History:  Diagnosis Date   ETOH abuse    Gout    History of chicken pox    Hyperlipidemia    Sleep apnea    on CPAP     Past Surgical History: Past Surgical History:  Procedure Laterality Date   NO PAST SURGERIES      Allergies: Allergies  Allergen Reactions   Other Anaphylaxis    Pet Dander   Shellfish Allergy Anaphylaxis    Outpatient Meds: Current Outpatient Medications  Medication Sig Dispense Refill   anastrozole (ARIMIDEX) 1 MG tablet Take 1 mg by mouth once a week.     Multiple Vitamin (MULTIVITAMIN WITH MINERALS) TABS tablet Take 1 tablet by mouth daily.     Multiple Vitamins-Minerals (HAIR SKIN AND NAILS FORMULA) TABS      NON FORMULARY daily. Bone and Marrow     TESTOSTERONE IM Inject into the muscle 2 (two) times a week.     VITAMIN D PO Take by mouth.     Current Facility-Administered Medications  Medication Dose Route Frequency Provider Last Rate Last Admin   0.9 %  sodium chloride infusion  500 mL Intravenous Once Danis, Estill Cotta III, MD          ___________________________________________________________________ Objective   Exam:  BP (!) 154/69   Pulse (!) 53   Temp 98 F (36.7 C) (Temporal)   Ht 5' 6.5" (1.689 m)   Wt 215 lb (97.5 kg)   SpO2 99%   BMI 34.18 kg/m   CV: regular , S1/S2 Resp: clear to auscultation bilaterally, normal RR and effort noted GI: soft, no tenderness, with active bowel sounds.   Assessment: Encounter Diagnosis  Name Primary?   Special screening for malignant neoplasms, colon Yes      Plan: Colonoscopy  The benefits and risks of the planned procedure were described in detail with the patient or (when appropriate) their health care proxy.  Risks were outlined as including, but not limited to, bleeding, infection, perforation, adverse medication reaction leading to cardiac or pulmonary decompensation, pancreatitis (if ERCP).  The limitation of incomplete mucosal visualization was also discussed.  No guarantees or warranties were given.    The patient is appropriate for an endoscopic procedure in the ambulatory setting.   - Wilfrid Lund, MD

## 2022-08-09 NOTE — Progress Notes (Signed)
Vss nad trans to pacu °

## 2022-08-09 NOTE — Progress Notes (Signed)
Pt's states no medical or surgical changes since previsit or office visit. 

## 2022-08-09 NOTE — Patient Instructions (Signed)
Handouts given on hemorrhoids and diverticulosis.  YOU HAD AN ENDOSCOPIC PROCEDURE TODAY AT [facility]:   Refer to the procedure report that was given to you for any specific questions about what was found during the examination.  If the procedure report does not answer your questions, please call your gastroenterologist to clarify.  If you requested that your care partner not be given the details of your procedure findings, then the procedure report has been included in a sealed envelope for you to review at your convenience later.  YOU SHOULD EXPECT: Some feelings of bloating in the abdomen. Passage of more gas than usual.  Walking can help get rid of the air that was put into your GI tract during the procedure and reduce the bloating. If you had a lower endoscopy (such as a colonoscopy or flexible sigmoidoscopy) you may notice spotting of blood in your stool or on the toilet paper. If you underwent a bowel prep for your procedure, you may not have a normal bowel movement for a few days.  Please Note:  You might notice some irritation and congestion in your nose or some drainage.  This is from the oxygen used during your procedure.  There is no need for concern and it should clear up in a day or so.  SYMPTOMS TO REPORT IMMEDIATELY:  Following lower endoscopy (colonoscopy or flexible sigmoidoscopy):  Excessive amounts of blood in the stool  Significant tenderness or worsening of abdominal pains  Swelling of the abdomen that is new, acute  Fever of 100F or higher  For urgent or emergent issues, a gastroenterologist can be reached at any hour by calling [phone]. Do not use MyChart messaging for urgent concerns.    DIET:  We do recommend a small meal at first, but then you may proceed to your regular diet.  Drink plenty of fluids but you should avoid alcoholic beverages for 24 hours.  ACTIVITY:  You should plan to take it easy for the rest of today and you should NOT  DRIVE or use heavy machinery until tomorrow (because of the sedation medicines used during the test).    FOLLOW UP: Our staff will call the number listed on your records the next business day following your procedure.  We will call around 7:15- 8:00 am to check on you and address any questions or concerns that you may have regarding the information given to you following your procedure. If we do not reach you, we will leave a message.     If any biopsies were taken you will be contacted by phone or by letter within the next 1-3 weeks.  Please call us at [phone] if you have not heard about the biopsies in 3 weeks.    SIGNATURES/CONFIDENTIALITY: You and/or your care partner have signed paperwork which will be entered into your electronic medical record.  These signatures attest to the fact that that the information above on your After Visit Summary has been reviewed and is understood.  Full responsibility of the confidentiality of this discharge information lies with you and/or your care-partner.

## 2022-08-09 NOTE — Op Note (Signed)
[place] Patient Name: Shelly Raga Procedure Date: 08/09/2022 1:58 PM MRN: [id number] Endoscopist: [place]. Loletha Carrow , MD, [id number] Age: 62 Referring MD:  Date of Birth: 1961/04/15 Gender: Male Account #: [id number] Procedure:                Colonoscopy Indications:              Screening for colorectal malignant neoplasm                           No polyps on September 2013 colonoscopy in                            [place]:                Monitored Anesthesia Care Procedure:                Pre-Anesthesia Assessment:                           - Prior to the procedure, a History and Physical                            was performed, and patient medications and                            allergies were reviewed. The patient's tolerance of                            previous anesthesia was also reviewed. The risks                            and benefits of the procedure and the sedation                            options and risks were discussed with the patient.                            All questions were answered, and informed consent                            was obtained. Prior Anticoagulants: The patient has                            taken no anticoagulant or antiplatelet agents. ASA                            Grade Assessment: II - A patient with mild systemic                            disease. After reviewing the risks and benefits,                            the patient was deemed in satisfactory condition to  undergo the procedure.                           After obtaining informed consent, the colonoscope                            was passed under direct vision. Throughout the                            procedure, the patient's blood pressure, pulse, and                            oxygen saturations were monitored continuously. The                            CF [id number] was introduced through the anus                             and advanced to the the cecum, identified by                            appendiceal orifice and ileocecal valve. The                            colonoscopy was performed without difficulty. The                            patient tolerated the procedure well. The quality                            of the bowel preparation was good in most areas                            after lavage, but fair in some areas with residual                            fibrous debris and diverticular stool balls that                            could not be cleared. The ileocecal valve,                            appendiceal orifice, and rectum were photographed.                            The bowel preparation used was SUPREP via split                            dose instruction. Scope In: 2:15:44 PM Scope Out: 2:31:11 PM Scope Withdrawal Time: 0 hours 10 minutes 37 seconds  Total Procedure Duration: 0 hours 15 minutes 27 seconds  Findings:                 The perianal and digital rectal examinations were  normal.                           Repeat examination of right colon under NBI                            performed.                           Many diverticula were found in the entire colon.                           Internal hemorrhoids were found.                           The exam was otherwise without abnormality on                            direct and retroflexion views. Complications:            No immediate complications. Estimated Blood Loss:     Estimated blood loss: none. Impression:               - Diverticulosis in the entire examined colon.                           - Internal hemorrhoids.                           - The examination was otherwise normal on direct                            and retroflexion views.                           - No specimens collected. Recommendation:           - Patient has a contact number available for                             emergencies. The signs and symptoms of potential                            delayed complications were discussed with the                            patient. Return to normal activities tomorrow.                            Written discharge instructions were provided to the                            patient.                           - Resume previous diet.                           -  Continue present medications.                           - Repeat colonoscopy in 5 years for screening                            purposes (see notes regarding prep quality above).                            Split dose GoLytely prep for next colonoscopy. Jonell Brumbaugh L. Loletha Carrow, MD 08/09/2022 2:35:09 PM This report has been signed electronically.

## 2022-08-12 ENCOUNTER — Telehealth: Payer: Self-pay

## 2022-08-12 NOTE — Telephone Encounter (Signed)
  Follow up Call-     08/09/2022    1:30 PM  Call back number  Post procedure Call Back phone  # [phone]  Permission to leave phone message Yes     Patient questions:  Do you have a fever, pain , or abdominal swelling? No. Pain Score  0 *  Have you tolerated food without any problems? Yes.    Have you been able to return to your normal activities? Yes.    Do you have any questions about your discharge instructions: Diet   No. Medications  No. Follow up visit  No.  Do you have questions or concerns about your Care? No.  Actions: * If pain score is 4 or above: No action needed, pain <4.

## 2022-09-16 ENCOUNTER — Ambulatory Visit (HOSPITAL_BASED_OUTPATIENT_CLINIC_OR_DEPARTMENT_OTHER): Payer: 59 | Admitting: Pulmonary Disease

## 2022-09-16 ENCOUNTER — Encounter (HOSPITAL_BASED_OUTPATIENT_CLINIC_OR_DEPARTMENT_OTHER): Payer: Self-pay | Admitting: Pulmonary Disease

## 2022-09-16 VITALS — BP 118/60 | HR 51 | Ht 66.0 in | Wt 221.8 lb

## 2022-09-16 DIAGNOSIS — G4733 Obstructive sleep apnea (adult) (pediatric): Secondary | ICD-10-CM

## 2022-09-16 DIAGNOSIS — Z720 Tobacco use: Secondary | ICD-10-CM | POA: Diagnosis not present

## 2022-09-16 NOTE — Patient Instructions (Signed)
  X try small size airfit F 30 mask  X change to auto CPAP 12-15 cm  If above does not take care of sound, let us know & we can change to F20 mask

## 2022-09-16 NOTE — Progress Notes (Unsigned)
   Subjective:    Patient ID: Brent Grist., male    DOB: 1961/05/14, 62 y.o.   MRN: [id number]  HPI  OSA was diagnosed more than 20 years ago and he is on his third CPAP machine, which he obtained in 2018  He smokes about half pack per day, about 25 pack years and drinks alcohol socially.   Significant tests/ events reviewed Splt night  2012 ([place])   LDCT 10/2021  Centrilobular emphsyema noted. Tiny right pulmonary nodules identified previously are stable, RADS 2  Review of Systems     Objective:   Physical Exam        Assessment & Plan:

## 2022-09-17 ENCOUNTER — Encounter (HOSPITAL_BASED_OUTPATIENT_CLINIC_OR_DEPARTMENT_OTHER): Payer: Self-pay | Admitting: Pulmonary Disease

## 2022-09-17 ENCOUNTER — Encounter: Payer: Self-pay | Admitting: Internal Medicine

## 2022-09-17 DIAGNOSIS — G4733 Obstructive sleep apnea (adult) (pediatric): Secondary | ICD-10-CM

## 2022-09-17 NOTE — Assessment & Plan Note (Signed)
CPAP download was reviewed which shows excellent control of events on CPAP 12 cm with mild leak and excellent compliance more than 8 hours per night without a single missed night.  Not sure fives complaints indicate inadequate pressure due to breakthrough snoring or whether she is simply hearing the leak Have asked him to change to a smaller AirFit F30 fullface mask and we will change to auto settings 12 to 15 cm and review download in 1 month.  They will report back based on symptoms  Weight loss encouraged, compliance with goal of at least 4-6 hrs every night is the expectation. Advised against medications with sedative side effects Cautioned against driving when sleepy - understanding that sleepiness will vary on a day to day basis

## 2022-09-17 NOTE — Assessment & Plan Note (Signed)
Quit smoking in 2020. Low-dose CT showed mild emphysema since he is asymptomatic prefers to hold off on spirometry testing

## 2022-09-19 NOTE — Telephone Encounter (Signed)
Dr. Vassie Loll, please advise on pt's message regarding getting a new CPAP as he is now eligible. Thanks.

## 2022-09-20 ENCOUNTER — Encounter: Payer: Self-pay | Admitting: Pulmonary Disease

## 2022-10-15 ENCOUNTER — Telehealth (HOSPITAL_BASED_OUTPATIENT_CLINIC_OR_DEPARTMENT_OTHER): Payer: Self-pay

## 2022-10-21 ENCOUNTER — Ambulatory Visit (HOSPITAL_BASED_OUTPATIENT_CLINIC_OR_DEPARTMENT_OTHER): Payer: 59

## 2022-11-19 ENCOUNTER — Encounter (HOSPITAL_BASED_OUTPATIENT_CLINIC_OR_DEPARTMENT_OTHER): Payer: Self-pay

## 2022-11-19 ENCOUNTER — Ambulatory Visit (HOSPITAL_BASED_OUTPATIENT_CLINIC_OR_DEPARTMENT_OTHER): Payer: 59

## 2023-02-12 IMAGING — CT CT CHEST LUNG CANCER SCREENING LOW DOSE W/O CM
2 of 5 series · 15 of 40 positions shown, 18 images · non-contrast
Comparison: 10/20/2019

CLINICAL DATA: 60-year-old male with 42 pack-year history of
smoking. Lung cancer screening.



[Series 3: thins · axial · 0.82mm/px · z∈[-311,-17]mm · 12 of 642 slices shown, 15 images]
[im 27/642  mediastinal]
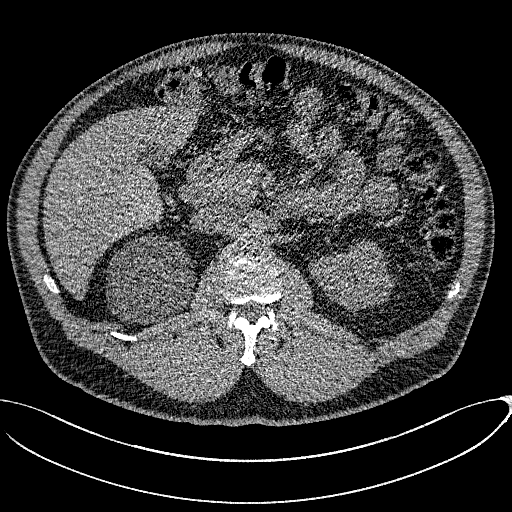
[im 27/642  lung]
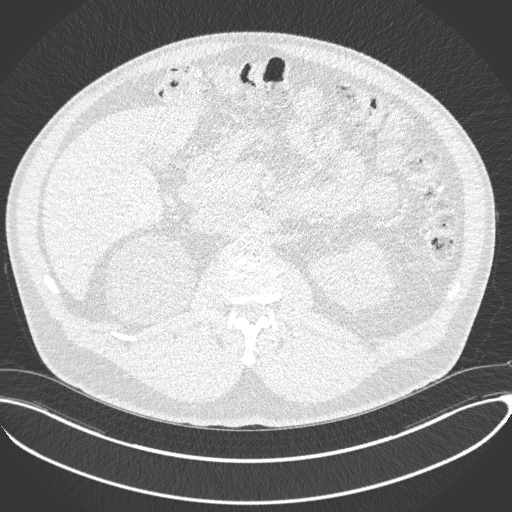
[im 81/642  lung]
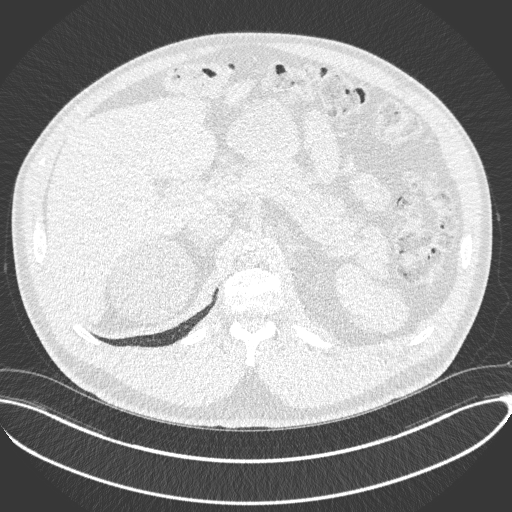
[im 134/642  lung]
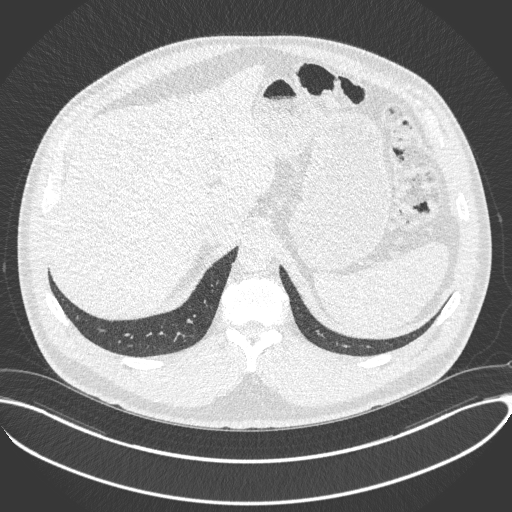
[im 187/642  lung]
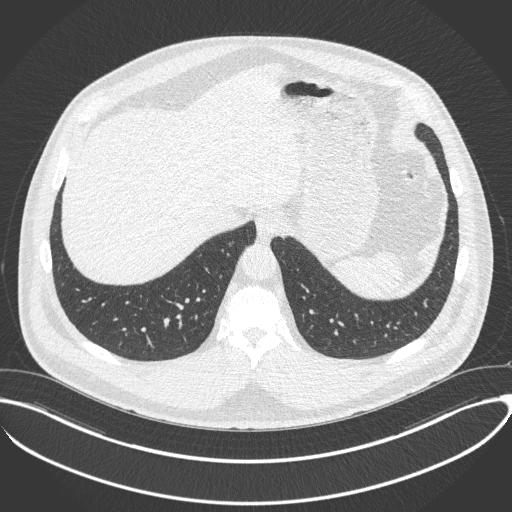
[im 241/642  mediastinal]
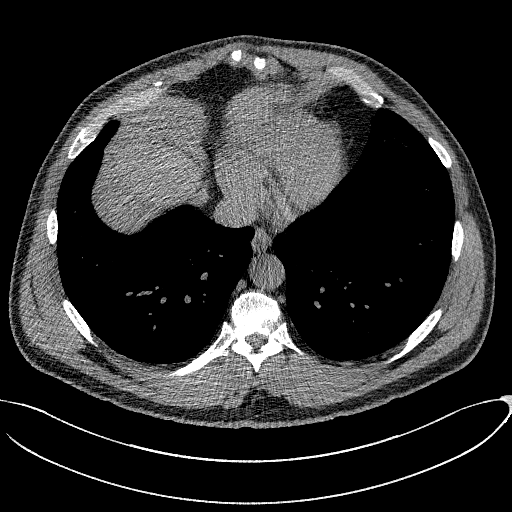
[im 241/642  lung]
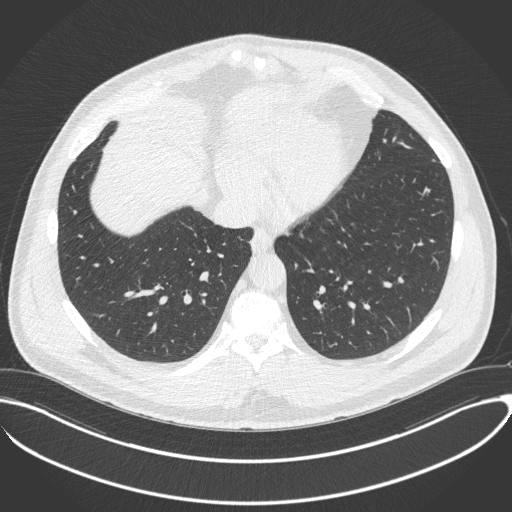
[im 294/642  lung]
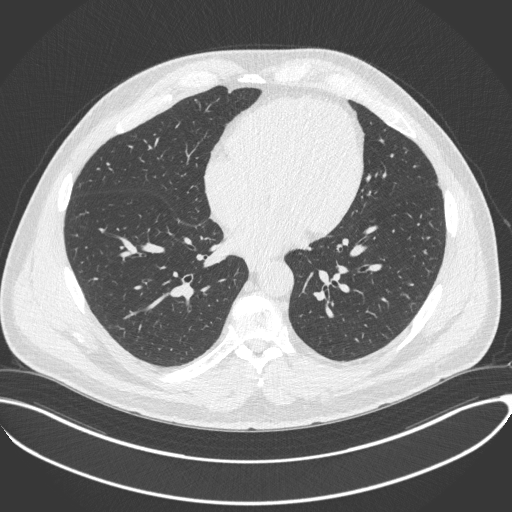
[im 348/642  lung]
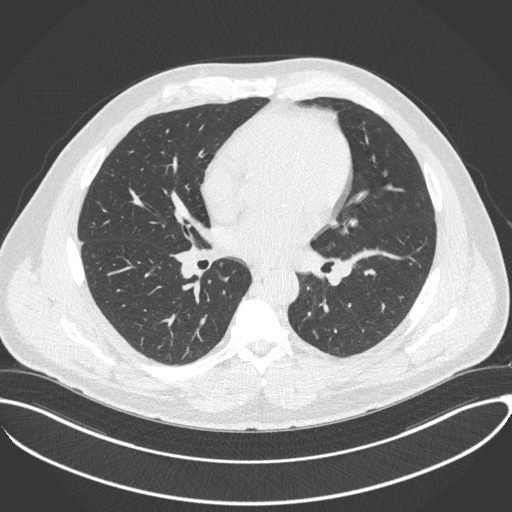
[im 401/642  lung]
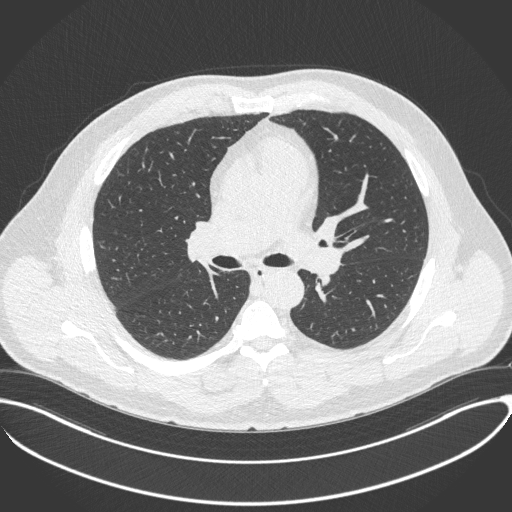
[im 455/642  mediastinal]
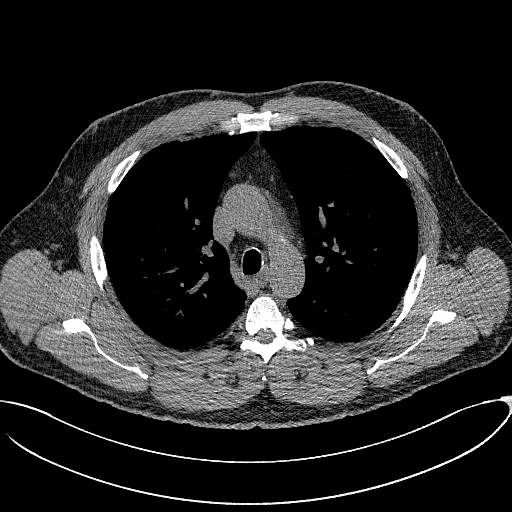
[im 455/642  lung]
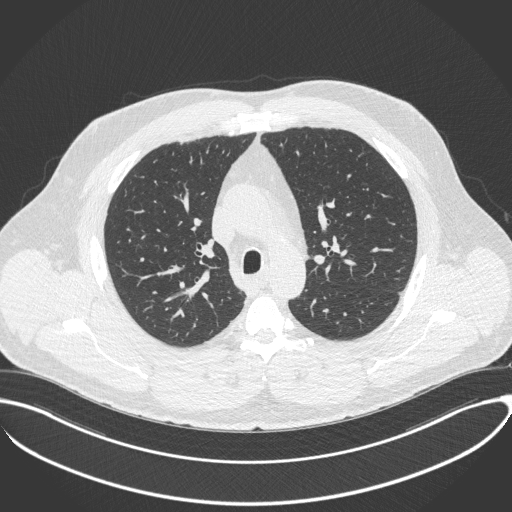
[im 508/642  lung]
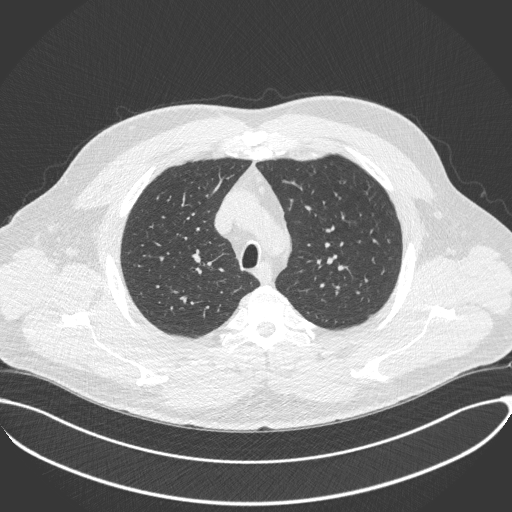
[im 561/642  lung]
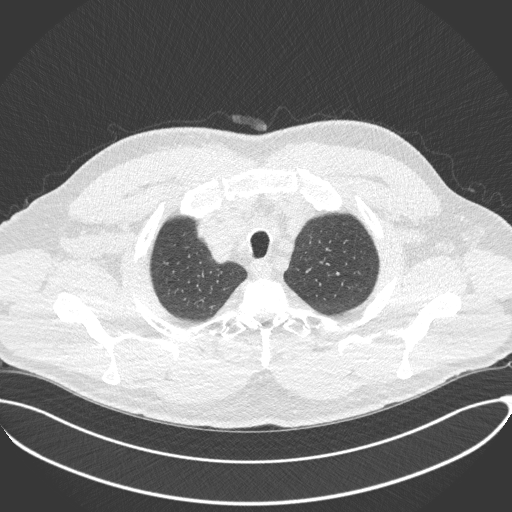
[im 615/642  lung]
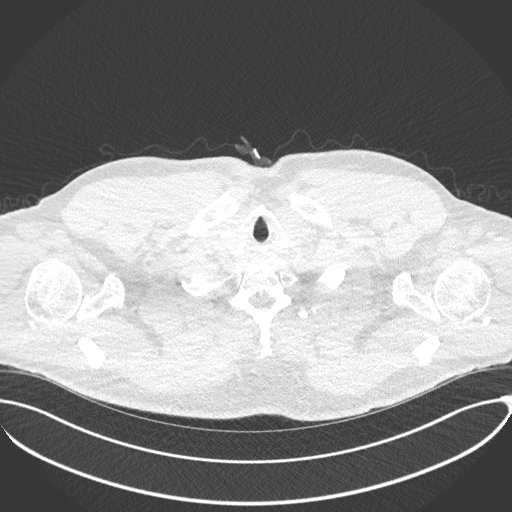

[Series 5: coronal · coronal · 0.73mm/px · 3 of 341 slices shown]
[im 69/341  lung]
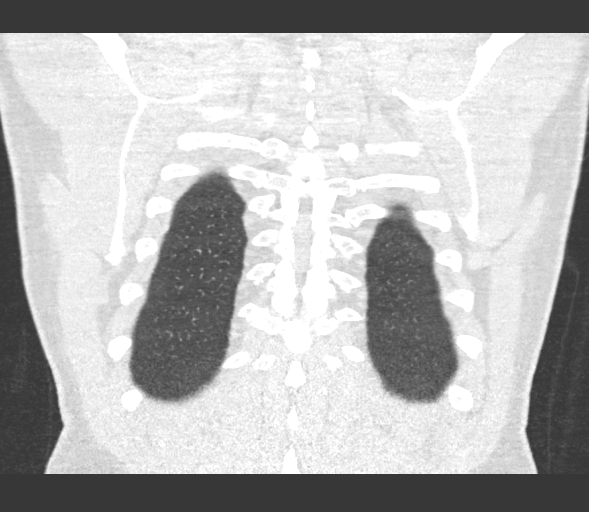
[im 137/341  lung]
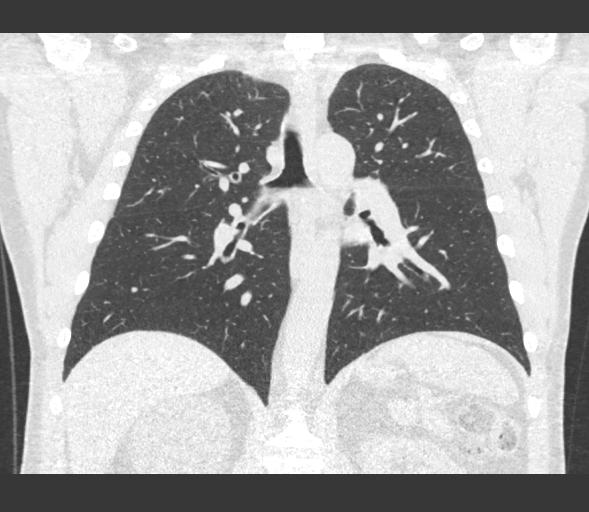
[im 205/341  lung]
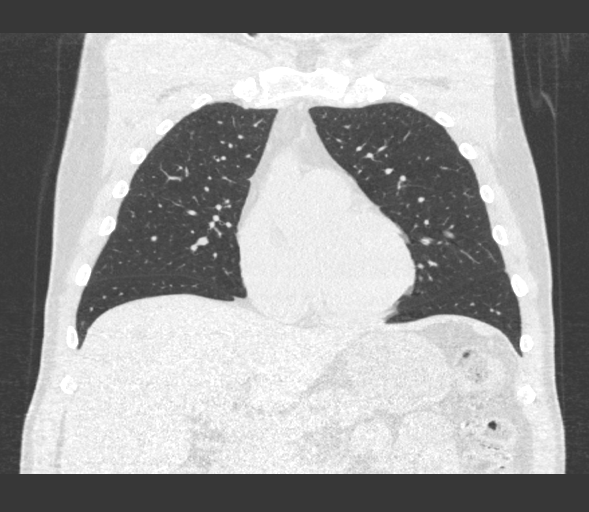

[15 of 40 positions shown; findings below may reference images not displayed]

FINDINGS: Cardiovascular: The heart size is normal. No substantial pericardial
effusion. Coronary artery calcification is evident. Mild
atherosclerotic calcification is noted in the wall of the thoracic
aorta.

Mediastinum/Nodes: No mediastinal lymphadenopathy. No evidence for
gross hilar lymphadenopathy although assessment is limited by the
lack of intravenous contrast on the current study. The esophagus has
normal imaging features. There is no axillary lymphadenopathy.

Lungs/Pleura: Centrilobular emphsyema noted. Tiny right pulmonary
nodules identified previously are stable in the interval. No new
suspicious pulmonary nodule or mass. No focal airspace
consolidation. No pleural effusion.

Upper Abdomen: 8.3 cm water density lesion superior to the right
kidney is minimally increased in size in the interval and remains
compatible with a renal cyst.

Musculoskeletal: No worrisome lytic or sclerotic osseous
abnormality.
IMPRESSION: 1. Lung-RADS 2, benign appearance or behavior. Continue annual
screening with low-dose chest CT without contrast in 12 months.
2. Aortic Atherosclerosis (ZL6P2-ADW.W) and Emphysema (ZL6P2-WLE.F).

## 2023-11-04 ENCOUNTER — Encounter: Payer: Self-pay | Admitting: *Deleted
